# Patient Record
Sex: Male | Born: 1955 | Race: White | Hispanic: No | Marital: Married | Smoking: Former smoker
Health system: Southern US, Community
[De-identification: ages and names within clinical notes are randomized; demographics above are authoritative.]

## PROBLEM LIST (undated history)

## (undated) ENCOUNTER — Ambulatory Visit: Admission: EM | Source: Home / Self Care

## (undated) DIAGNOSIS — E119 Type 2 diabetes mellitus without complications: Secondary | ICD-10-CM

## (undated) DIAGNOSIS — E785 Hyperlipidemia, unspecified: Secondary | ICD-10-CM

## (undated) DIAGNOSIS — I10 Essential (primary) hypertension: Secondary | ICD-10-CM

## (undated) HISTORY — DX: Essential (primary) hypertension: I10

## (undated) HISTORY — DX: Type 2 diabetes mellitus without complications: E11.9

## (undated) HISTORY — DX: Hyperlipidemia, unspecified: E78.5

---
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
## 1981-09-01 HISTORY — PX: KNEE ARTHROSCOPY: SHX127

## 1988-09-01 HISTORY — PX: TENDON REPAIR: SHX5111

## 2007-09-02 HISTORY — PX: COLONOSCOPY: SHX174

## 2015-12-10 DIAGNOSIS — I129 Hypertensive chronic kidney disease with stage 1 through stage 4 chronic kidney disease, or unspecified chronic kidney disease: Secondary | ICD-10-CM | POA: Diagnosis not present

## 2015-12-10 DIAGNOSIS — N183 Chronic kidney disease, stage 3 (moderate): Secondary | ICD-10-CM | POA: Diagnosis not present

## 2015-12-11 DIAGNOSIS — E1122 Type 2 diabetes mellitus with diabetic chronic kidney disease: Secondary | ICD-10-CM | POA: Diagnosis not present

## 2015-12-11 DIAGNOSIS — I129 Hypertensive chronic kidney disease with stage 1 through stage 4 chronic kidney disease, or unspecified chronic kidney disease: Secondary | ICD-10-CM | POA: Insufficient documentation

## 2015-12-11 DIAGNOSIS — Z7984 Long term (current) use of oral hypoglycemic drugs: Secondary | ICD-10-CM | POA: Diagnosis not present

## 2015-12-11 DIAGNOSIS — N183 Chronic kidney disease, stage 3 unspecified: Secondary | ICD-10-CM | POA: Insufficient documentation

## 2016-02-13 DIAGNOSIS — N183 Chronic kidney disease, stage 3 (moderate): Secondary | ICD-10-CM | POA: Diagnosis not present

## 2016-02-13 DIAGNOSIS — E7800 Pure hypercholesterolemia, unspecified: Secondary | ICD-10-CM | POA: Diagnosis not present

## 2016-02-13 DIAGNOSIS — E119 Type 2 diabetes mellitus without complications: Secondary | ICD-10-CM | POA: Diagnosis not present

## 2016-02-13 DIAGNOSIS — I10 Essential (primary) hypertension: Secondary | ICD-10-CM | POA: Diagnosis not present

## 2016-04-17 DIAGNOSIS — N183 Chronic kidney disease, stage 3 (moderate): Secondary | ICD-10-CM | POA: Diagnosis not present

## 2016-04-21 DIAGNOSIS — N183 Chronic kidney disease, stage 3 (moderate): Secondary | ICD-10-CM | POA: Diagnosis not present

## 2016-04-21 DIAGNOSIS — E1122 Type 2 diabetes mellitus with diabetic chronic kidney disease: Secondary | ICD-10-CM | POA: Diagnosis not present

## 2016-04-21 DIAGNOSIS — I129 Hypertensive chronic kidney disease with stage 1 through stage 4 chronic kidney disease, or unspecified chronic kidney disease: Secondary | ICD-10-CM | POA: Diagnosis not present

## 2016-06-04 DIAGNOSIS — E291 Testicular hypofunction: Secondary | ICD-10-CM | POA: Diagnosis not present

## 2016-06-09 DIAGNOSIS — E291 Testicular hypofunction: Secondary | ICD-10-CM | POA: Diagnosis not present

## 2016-06-09 DIAGNOSIS — N5201 Erectile dysfunction due to arterial insufficiency: Secondary | ICD-10-CM | POA: Diagnosis not present

## 2016-09-02 DIAGNOSIS — E1165 Type 2 diabetes mellitus with hyperglycemia: Secondary | ICD-10-CM | POA: Diagnosis not present

## 2016-09-02 DIAGNOSIS — M109 Gout, unspecified: Secondary | ICD-10-CM | POA: Diagnosis not present

## 2016-09-02 DIAGNOSIS — Z23 Encounter for immunization: Secondary | ICD-10-CM | POA: Diagnosis not present

## 2016-09-02 DIAGNOSIS — E7800 Pure hypercholesterolemia, unspecified: Secondary | ICD-10-CM | POA: Diagnosis not present

## 2016-09-02 DIAGNOSIS — I10 Essential (primary) hypertension: Secondary | ICD-10-CM | POA: Diagnosis not present

## 2016-09-02 DIAGNOSIS — Z0000 Encounter for general adult medical examination without abnormal findings: Secondary | ICD-10-CM | POA: Diagnosis not present

## 2016-09-16 DIAGNOSIS — E291 Testicular hypofunction: Secondary | ICD-10-CM | POA: Diagnosis not present

## 2016-09-23 DIAGNOSIS — E291 Testicular hypofunction: Secondary | ICD-10-CM | POA: Diagnosis not present

## 2016-10-20 DIAGNOSIS — N183 Chronic kidney disease, stage 3 (moderate): Secondary | ICD-10-CM | POA: Diagnosis not present

## 2016-10-22 DIAGNOSIS — N183 Chronic kidney disease, stage 3 (moderate): Secondary | ICD-10-CM | POA: Diagnosis not present

## 2016-10-22 DIAGNOSIS — I129 Hypertensive chronic kidney disease with stage 1 through stage 4 chronic kidney disease, or unspecified chronic kidney disease: Secondary | ICD-10-CM | POA: Diagnosis not present

## 2016-10-22 DIAGNOSIS — E1122 Type 2 diabetes mellitus with diabetic chronic kidney disease: Secondary | ICD-10-CM | POA: Diagnosis not present

## 2016-11-10 DIAGNOSIS — E1165 Type 2 diabetes mellitus with hyperglycemia: Secondary | ICD-10-CM | POA: Diagnosis not present

## 2016-11-10 DIAGNOSIS — N183 Chronic kidney disease, stage 3 (moderate): Secondary | ICD-10-CM | POA: Diagnosis not present

## 2016-11-10 DIAGNOSIS — H8110 Benign paroxysmal vertigo, unspecified ear: Secondary | ICD-10-CM | POA: Diagnosis not present

## 2016-12-05 DIAGNOSIS — H40039 Anatomical narrow angle, unspecified eye: Secondary | ICD-10-CM | POA: Diagnosis not present

## 2017-02-25 DIAGNOSIS — N410 Acute prostatitis: Secondary | ICD-10-CM | POA: Diagnosis not present

## 2017-03-02 DIAGNOSIS — D729 Disorder of white blood cells, unspecified: Secondary | ICD-10-CM | POA: Diagnosis not present

## 2017-03-02 DIAGNOSIS — E119 Type 2 diabetes mellitus without complications: Secondary | ICD-10-CM | POA: Diagnosis not present

## 2017-04-08 DIAGNOSIS — E1165 Type 2 diabetes mellitus with hyperglycemia: Secondary | ICD-10-CM | POA: Diagnosis not present

## 2017-05-05 DIAGNOSIS — N183 Chronic kidney disease, stage 3 (moderate): Secondary | ICD-10-CM | POA: Diagnosis not present

## 2017-05-06 DIAGNOSIS — E1122 Type 2 diabetes mellitus with diabetic chronic kidney disease: Secondary | ICD-10-CM | POA: Diagnosis not present

## 2017-05-06 DIAGNOSIS — I129 Hypertensive chronic kidney disease with stage 1 through stage 4 chronic kidney disease, or unspecified chronic kidney disease: Secondary | ICD-10-CM | POA: Diagnosis not present

## 2017-05-06 DIAGNOSIS — N183 Chronic kidney disease, stage 3 (moderate): Secondary | ICD-10-CM | POA: Diagnosis not present

## 2017-06-01 DIAGNOSIS — Z125 Encounter for screening for malignant neoplasm of prostate: Secondary | ICD-10-CM | POA: Diagnosis not present

## 2017-06-01 DIAGNOSIS — E291 Testicular hypofunction: Secondary | ICD-10-CM | POA: Diagnosis not present

## 2017-06-12 DIAGNOSIS — E291 Testicular hypofunction: Secondary | ICD-10-CM | POA: Diagnosis not present

## 2017-06-12 DIAGNOSIS — N5201 Erectile dysfunction due to arterial insufficiency: Secondary | ICD-10-CM | POA: Diagnosis not present

## 2017-09-07 DIAGNOSIS — E119 Type 2 diabetes mellitus without complications: Secondary | ICD-10-CM | POA: Diagnosis not present

## 2017-09-07 DIAGNOSIS — E7800 Pure hypercholesterolemia, unspecified: Secondary | ICD-10-CM | POA: Diagnosis not present

## 2017-09-07 DIAGNOSIS — I10 Essential (primary) hypertension: Secondary | ICD-10-CM | POA: Diagnosis not present

## 2017-09-07 DIAGNOSIS — Z0000 Encounter for general adult medical examination without abnormal findings: Secondary | ICD-10-CM | POA: Diagnosis not present

## 2017-09-07 DIAGNOSIS — Z23 Encounter for immunization: Secondary | ICD-10-CM | POA: Diagnosis not present

## 2017-11-02 DIAGNOSIS — N183 Chronic kidney disease, stage 3 (moderate): Secondary | ICD-10-CM | POA: Diagnosis not present

## 2017-11-04 DIAGNOSIS — I129 Hypertensive chronic kidney disease with stage 1 through stage 4 chronic kidney disease, or unspecified chronic kidney disease: Secondary | ICD-10-CM | POA: Diagnosis not present

## 2017-11-04 DIAGNOSIS — E1122 Type 2 diabetes mellitus with diabetic chronic kidney disease: Secondary | ICD-10-CM | POA: Diagnosis not present

## 2017-11-04 DIAGNOSIS — N183 Chronic kidney disease, stage 3 (moderate): Secondary | ICD-10-CM | POA: Diagnosis not present

## 2018-03-15 DIAGNOSIS — N183 Chronic kidney disease, stage 3 (moderate): Secondary | ICD-10-CM | POA: Diagnosis not present

## 2018-03-15 DIAGNOSIS — I10 Essential (primary) hypertension: Secondary | ICD-10-CM | POA: Diagnosis not present

## 2018-03-15 DIAGNOSIS — E7800 Pure hypercholesterolemia, unspecified: Secondary | ICD-10-CM | POA: Diagnosis not present

## 2018-03-15 DIAGNOSIS — E1169 Type 2 diabetes mellitus with other specified complication: Secondary | ICD-10-CM | POA: Diagnosis not present

## 2018-03-15 DIAGNOSIS — Z1159 Encounter for screening for other viral diseases: Secondary | ICD-10-CM | POA: Diagnosis not present

## 2018-05-06 DIAGNOSIS — N183 Chronic kidney disease, stage 3 (moderate): Secondary | ICD-10-CM | POA: Diagnosis not present

## 2018-05-10 DIAGNOSIS — I129 Hypertensive chronic kidney disease with stage 1 through stage 4 chronic kidney disease, or unspecified chronic kidney disease: Secondary | ICD-10-CM | POA: Diagnosis not present

## 2018-05-10 DIAGNOSIS — N183 Chronic kidney disease, stage 3 (moderate): Secondary | ICD-10-CM | POA: Diagnosis not present

## 2018-05-10 DIAGNOSIS — E1122 Type 2 diabetes mellitus with diabetic chronic kidney disease: Secondary | ICD-10-CM | POA: Diagnosis not present

## 2018-07-13 DIAGNOSIS — R948 Abnormal results of function studies of other organs and systems: Secondary | ICD-10-CM | POA: Diagnosis not present

## 2018-07-13 DIAGNOSIS — E291 Testicular hypofunction: Secondary | ICD-10-CM | POA: Diagnosis not present

## 2018-07-21 DIAGNOSIS — E291 Testicular hypofunction: Secondary | ICD-10-CM | POA: Diagnosis not present

## 2018-07-21 DIAGNOSIS — N5201 Erectile dysfunction due to arterial insufficiency: Secondary | ICD-10-CM | POA: Diagnosis not present

## 2018-09-21 DIAGNOSIS — Z0000 Encounter for general adult medical examination without abnormal findings: Secondary | ICD-10-CM | POA: Diagnosis not present

## 2018-09-21 DIAGNOSIS — I10 Essential (primary) hypertension: Secondary | ICD-10-CM | POA: Diagnosis not present

## 2018-09-21 DIAGNOSIS — Z23 Encounter for immunization: Secondary | ICD-10-CM | POA: Diagnosis not present

## 2018-09-21 DIAGNOSIS — E119 Type 2 diabetes mellitus without complications: Secondary | ICD-10-CM | POA: Diagnosis not present

## 2018-09-21 DIAGNOSIS — E7800 Pure hypercholesterolemia, unspecified: Secondary | ICD-10-CM | POA: Diagnosis not present

## 2018-09-27 DIAGNOSIS — M720 Palmar fascial fibromatosis [Dupuytren]: Secondary | ICD-10-CM | POA: Insufficient documentation

## 2018-09-27 DIAGNOSIS — M79645 Pain in left finger(s): Secondary | ICD-10-CM | POA: Insufficient documentation

## 2018-11-04 DIAGNOSIS — N183 Chronic kidney disease, stage 3 (moderate): Secondary | ICD-10-CM | POA: Diagnosis not present

## 2018-11-08 DIAGNOSIS — J069 Acute upper respiratory infection, unspecified: Secondary | ICD-10-CM | POA: Diagnosis not present

## 2018-11-09 DIAGNOSIS — E1122 Type 2 diabetes mellitus with diabetic chronic kidney disease: Secondary | ICD-10-CM | POA: Diagnosis not present

## 2018-11-09 DIAGNOSIS — N183 Chronic kidney disease, stage 3 (moderate): Secondary | ICD-10-CM | POA: Diagnosis not present

## 2018-11-09 DIAGNOSIS — I129 Hypertensive chronic kidney disease with stage 1 through stage 4 chronic kidney disease, or unspecified chronic kidney disease: Secondary | ICD-10-CM | POA: Diagnosis not present

## 2019-03-29 DIAGNOSIS — N183 Chronic kidney disease, stage 3 (moderate): Secondary | ICD-10-CM | POA: Diagnosis not present

## 2019-03-29 DIAGNOSIS — E7800 Pure hypercholesterolemia, unspecified: Secondary | ICD-10-CM | POA: Diagnosis not present

## 2019-03-29 DIAGNOSIS — Z23 Encounter for immunization: Secondary | ICD-10-CM | POA: Diagnosis not present

## 2019-03-29 DIAGNOSIS — I10 Essential (primary) hypertension: Secondary | ICD-10-CM | POA: Diagnosis not present

## 2019-03-29 DIAGNOSIS — E1169 Type 2 diabetes mellitus with other specified complication: Secondary | ICD-10-CM | POA: Diagnosis not present

## 2019-05-12 DIAGNOSIS — N183 Chronic kidney disease, stage 3 (moderate): Secondary | ICD-10-CM | POA: Diagnosis not present

## 2019-05-16 DIAGNOSIS — N183 Chronic kidney disease, stage 3 (moderate): Secondary | ICD-10-CM | POA: Diagnosis not present

## 2019-05-16 DIAGNOSIS — E559 Vitamin D deficiency, unspecified: Secondary | ICD-10-CM | POA: Diagnosis not present

## 2019-05-16 DIAGNOSIS — E1122 Type 2 diabetes mellitus with diabetic chronic kidney disease: Secondary | ICD-10-CM | POA: Diagnosis not present

## 2019-05-16 DIAGNOSIS — I129 Hypertensive chronic kidney disease with stage 1 through stage 4 chronic kidney disease, or unspecified chronic kidney disease: Secondary | ICD-10-CM | POA: Diagnosis not present

## 2019-06-29 ENCOUNTER — Ambulatory Visit (INDEPENDENT_AMBULATORY_CARE_PROVIDER_SITE_OTHER): Payer: BC Managed Care – PPO | Admitting: Registered Nurse

## 2019-06-29 ENCOUNTER — Other Ambulatory Visit: Payer: Self-pay

## 2019-06-29 ENCOUNTER — Encounter: Payer: Self-pay | Admitting: Registered Nurse

## 2019-06-29 VITALS — BP 142/82 | HR 86 | Temp 98.6°F | Ht 70.0 in | Wt 220.0 lb

## 2019-06-29 DIAGNOSIS — N489 Disorder of penis, unspecified: Secondary | ICD-10-CM | POA: Diagnosis not present

## 2019-06-29 NOTE — Progress Notes (Signed)
Acute Office Visit  Subjective:    Patient ID: Samuel Sanders, male    DOB: Oct 18, 1955, 63 y.o.   MRN: [id number]  Chief Complaint  Patient presents with  . Mass    possibly establish care. something on private area. needs to be looked at     HPI Patient is in today for concern over lesion on shaft of penis.  States that on Saturday night during sexual activity, he is concerned that his wife's nail may have cut his penis between the shaft and glans. There was some blood, swelling,and tenderness. Notes drastic improvement since, but still open lesion. He has been applying neosporin daily.  No past medical history on file.  History reviewed. No pertinent surgical history.  No family history on file.  Social History   Socioeconomic History  . Marital status: Unknown    Spouse name: Not on file  . Number of children: Not on file  . Years of education: Not on file  . Highest education level: Not on file  Occupational History  . Not on file  Social Needs  . Financial resource strain: Not on file  . Food insecurity    Worry: Not on file    Inability: Not on file  . Transportation needs    Medical: Not on file    Non-medical: Not on file  Tobacco Use  . Smoking status: Former Smoker    Quit date: 06/28/2004    Years since quitting: 15.0  . Smokeless tobacco: Never Used  Substance and Sexual Activity  . Alcohol use: Not Currently  . Drug use: Not Currently  . Sexual activity: Not on file  Lifestyle  . Physical activity    Days per week: Not on file    Minutes per session: Not on file  . Stress: Not on file  Relationships  . Social Herbalist on phone: Not on file    Gets together: Not on file    Attends religious service: Not on file    Active member of club or organization: Not on file    Attends meetings of clubs or organizations: Not on file    Relationship status: Not on file  . Intimate partner violence    Fear of current or ex partner: Not on  file    Emotionally abused: Not on file    Physically abused: Not on file    Forced sexual activity: Not on file  Other Topics Concern  . Not on file  Social History Narrative  . Not on file    Outpatient Medications Prior to Visit  Medication Sig Dispense Refill  . allopurinol (ZYLOPRIM) 100 MG tablet allopurinol 100 mg tablet    . amLODipine-benazepril (LOTREL) 5-20 MG capsule Take by mouth.    Marland Kitchen atorvastatin (LIPITOR) 20 MG tablet Take 20 mg by mouth daily.    . fenofibrate (TRICOR) 145 MG tablet TAKE 1 TABLET ONCE A DAY ORALLY    . glimepiride (AMARYL) 2 MG tablet glimepiride 2 mg tablet    . INVOKANA 100 MG TABS tablet Take 100 mg by mouth daily.    . metFORMIN (GLUCOPHAGE) 1000 MG tablet TAKE 1/2 TABLET BY MOUTH EVERY MORNING AND 1 & 1/2 TABLETS IN THE EVENING    . Omega-3 1000 MG CAPS Take by mouth.    . testosterone cypionate (DEPOTESTOSTERONE CYPIONATE) 200 MG/ML injection Inject 200 mg into the muscle once a week.    . Travoprost, BAK Free, (TRAVATAN Z) 0.004 %  SOLN ophthalmic solution PUT 1 DROP INTO BOTH EYES AT BEDTIME AS DIRECTED     No facility-administered medications prior to visit.     No Known Allergies  Review of Systems  Constitutional: Negative.   HENT: Negative.   Eyes: Negative.   Respiratory: Negative.   Cardiovascular: Negative.   Gastrointestinal: Negative.   Genitourinary: Negative.  Negative for dysuria, flank pain, frequency, hematuria and urgency.  Musculoskeletal: Negative.   Skin: Negative.   Neurological: Negative.   Endo/Heme/Allergies: Negative.   Psychiatric/Behavioral: Negative.   All other systems reviewed and are negative.      Objective:    Physical Exam  Genitourinary:       BP (!) 142/82 (BP Location: Left Arm, Patient Position: Sitting, Cuff Size: Normal)   Pulse 86   Temp 98.6 F (37 C) (Oral)   Ht _0  (1.778 m)   Wt 220 lb (99.8 kg)   SpO2 97%   BMI 31.57 kg/m  Wt Readings from Last 3 Encounters:   06/29/19 220 lb (99.8 kg)    Health Maintenance Due  Topic Date Due  . Hepatitis C Screening  29-Sep-1955  . HIV Screening  04/01/1970  . TETANUS/TDAP  04/01/1974  . COLONOSCOPY  04/01/2005  . INFLUENZA VACCINE  04/02/2019    There are no preventive care reminders to display for this patient.   No results found for: TSH No results found for: WBC, HGB, HCT, MCV, PLT No results found for: NA, K, CHLORIDE, CO2, GLUCOSE, BUN, CREATININE, BILITOT, ALKPHOS, AST, ALT, PROT, ALBUMIN, CALCIUM, ANIONGAP, EGFR, GFR No results found for: CHOL No results found for: HDL No results found for: LDLCALC No results found for: TRIG No results found for: CHOLHDL No results found for: HGBA1C     Assessment & Plan:   Problem List Items Addressed This Visit    None    Visit Diagnoses    Lesion of penis    -  Primary       No orders of the defined types were placed in this encounter.  PLAN  Discussed that this wound looks very straight forward and like it is healing appropriately.   Continue to wash daily, apply neosporin, and try to keep clean and dry  Return to clinic if wound worsens or fails to improve  Patient encouraged to call clinic with any questions, comments, or concerns.    Maximiano Coss, NP

## 2019-06-29 NOTE — Patient Instructions (Signed)
° ° ° °  If you have lab work done today you will be contacted with your lab results within the next 2 weeks.  If you have not heard from us then please contact us. The fastest way to get your results is to register for My Chart. ° ° °IF you received an x-ray today, you will receive an invoice from [facility]. Please contact [facility] at [phone] with questions or concerns regarding your invoice.  ° °IF you received labwork today, you will receive an invoice from [facility]. Please contact [facility] at [phone] with questions or concerns regarding your invoice.  ° °Our billing staff will not be able to assist you with questions regarding bills from these companies. ° °You will be contacted with the lab results as soon as they are available. The fastest way to get your results is to activate your My Chart account. Instructions are located on the last page of this paperwork. If you have not heard from us regarding the results in 2 weeks, please contact this office. °  ° ° ° °

## 2019-07-04 ENCOUNTER — Encounter: Payer: Self-pay | Admitting: Family Medicine

## 2019-07-04 ENCOUNTER — Ambulatory Visit: Payer: BC Managed Care – PPO | Admitting: Family Medicine

## 2019-07-04 ENCOUNTER — Other Ambulatory Visit: Payer: Self-pay

## 2019-07-04 VITALS — BP 150/77 | HR 80 | Temp 98.2°F | Ht 70.0 in | Wt 221.2 lb

## 2019-07-04 DIAGNOSIS — Z20822 Contact with and (suspected) exposure to covid-19: Secondary | ICD-10-CM

## 2019-07-04 DIAGNOSIS — Z20828 Contact with and (suspected) exposure to other viral communicable diseases: Secondary | ICD-10-CM

## 2019-07-04 DIAGNOSIS — Z7189 Other specified counseling: Secondary | ICD-10-CM

## 2019-07-04 DIAGNOSIS — R42 Dizziness and giddiness: Secondary | ICD-10-CM

## 2019-07-04 NOTE — Patient Instructions (Signed)
° ° ° °  If you have lab work done today you will be contacted with your lab results within the next 2 weeks.  If you have not heard from us then please contact us. The fastest way to get your results is to register for My Chart. ° ° °IF you received an x-ray today, you will receive an invoice from [facility]. Please contact [facility] at [phone] with questions or concerns regarding your invoice.  ° °IF you received labwork today, you will receive an invoice from [facility]. Please contact [facility] at [phone] with questions or concerns regarding your invoice.  ° °Our billing staff will not be able to assist you with questions regarding bills from these companies. ° °You will be contacted with the lab results as soon as they are available. The fastest way to get your results is to activate your My Chart account. Instructions are located on the last page of this paperwork. If you have not heard from us regarding the results in 2 weeks, please contact this office. °  ° ° ° °

## 2019-07-04 NOTE — Progress Notes (Signed)
Established Patient Office Visit  Subjective:  Patient ID: Samuel Sanders, male    DOB: 03/08/1956  Age: 63 y.o. MRN: [id number]  CC:  Chief Complaint  Patient presents with  . Dizziness  . off balance    started Friday night     HPI [name] presents for   COVID LIKE SYMPTOMS Starting on 07/01/2019 he woke up at 5:30am and then sat in a chair and fell asleep He woke up from the chair at 10:30am He states that on the way home he was shivering He took his temperature 102 He also had a headache but felt very dizzy He reports that he just felt "off"  His temperature went to normal after the weekend  He denies cough, shortness of breath and diarrhea  He reports that he feels like his symptoms resolve  He states that over the past few days he had a difficulty with his appetite and did not feel like eating He was able to taste and smell.   Essential Hypertension He is on amlodipine-benazepril 5-20mg  He reports that at home his bp is 118/70 and is well controlled He checks it a few times a week and his readings are always normal except at the doctor's office. He is a former smoker. BP Readings from Last 3 Encounters:  07/04/19 (!) 150/77  06/29/19 (!) 142/82    No past medical history on file.  No past surgical history on file.  No family history on file.  Social History   Socioeconomic History  . Marital status: Unknown    Spouse name: Not on file  . Number of children: Not on file  . Years of education: Not on file  . Highest education level: Not on file  Occupational History  . Not on file  Social Needs  . Financial resource strain: Not on file  . Food insecurity    Worry: Not on file    Inability: Not on file  . Transportation needs    Medical: Not on file    Non-medical: Not on file  Tobacco Use  . Smoking status: Former Smoker    Quit date: 06/28/2004    Years since quitting: 15.0  . Smokeless tobacco: Never Used  Substance and Sexual  Activity  . Alcohol use: Not Currently  . Drug use: Not Currently  . Sexual activity: Not on file  Lifestyle  . Physical activity    Days per week: Not on file    Minutes per session: Not on file  . Stress: Not on file  Relationships  . Social Herbalist on phone: Not on file    Gets together: Not on file    Attends religious service: Not on file    Active member of club or organization: Not on file    Attends meetings of clubs or organizations: Not on file    Relationship status: Not on file  . Intimate partner violence    Fear of current or ex partner: Not on file    Emotionally abused: Not on file    Physically abused: Not on file    Forced sexual activity: Not on file  Other Topics Concern  . Not on file  Social History Narrative  . Not on file    Outpatient Medications Prior to Visit  Medication Sig Dispense Refill  . allopurinol (ZYLOPRIM) 100 MG tablet allopurinol 100 mg tablet    . amLODipine-benazepril (LOTREL) 5-20 MG capsule Take by mouth.    Marland Kitchen  atorvastatin (LIPITOR) 20 MG tablet Take 20 mg by mouth daily.    . fenofibrate (TRICOR) 145 MG tablet TAKE 1 TABLET ONCE A DAY ORALLY    . glimepiride (AMARYL) 2 MG tablet glimepiride 2 mg tablet    . INVOKANA 100 MG TABS tablet Take 100 mg by mouth daily.    . metFORMIN (GLUCOPHAGE) 1000 MG tablet TAKE 1/2 TABLET BY MOUTH EVERY MORNING AND 1 & 1/2 TABLETS IN THE EVENING    . Omega-3 1000 MG CAPS Take by mouth.    . testosterone cypionate (DEPOTESTOSTERONE CYPIONATE) 200 MG/ML injection Inject 200 mg into the muscle once a week.    . Travoprost, BAK Free, (TRAVATAN Z) 0.004 % SOLN ophthalmic solution PUT 1 DROP INTO BOTH EYES AT BEDTIME AS DIRECTED     No facility-administered medications prior to visit.     No Known Allergies  ROS Review of Systems Review of Systems  Constitutional: SEE HPI HENT: Negative for congestion, nosebleeds, trouble swallowing and voice change.   Respiratory: Negative for cough,  shortness of breath and wheezing.   Gastrointestinal: Negative for diarrhea, nausea and vomiting.  Genitourinary: Negative for difficulty urinating, dysuria, flank pain and hematuria.  Musculoskeletal: see hpi Neurological: see hpi See HPI. All other review of systems negative.     Objective:    Physical Exam  BP (!) 150/77   Pulse 80   Temp 98.2 F (36.8 C) (Oral)   Ht 5\' 10"  (1.778 m)   Wt 221 lb 3.2 oz (100.3 kg)   SpO2 97%   BMI 31.74 kg/m  Wt Readings from Last 3 Encounters:  07/04/19 221 lb 3.2 oz (100.3 kg)  06/29/19 220 lb (99.8 kg)   Physical Exam  Constitutional: Oriented to person, place, and time. Appears well-developed and well-nourished.  HENT:  Head: Normocephalic and atraumatic.  Eyes: Conjunctivae and EOM are normal.  Ears: TM clear bilaterally Cardiovascular: Normal rate, regular rhythm, normal heart sounds and intact distal pulses.  No murmur heard. Pulmonary/Chest: Effort normal and breath sounds normal. No stridor. No respiratory distress. Has no wheezes.  Abdomen: nondistended, normoactive bs, soft, nontender Neurological: Is alert and oriented to person, place, and time.  Skin: Skin is warm. Capillary refill takes less than 2 seconds.  Psychiatric: Has a normal mood and affect. Behavior is normal. Judgment and thought content normal.    Health Maintenance Due  Topic Date Due  . Hepatitis C Screening  1955-09-03  . HIV Screening  04/01/1970  . TETANUS/TDAP  04/01/1974  . COLONOSCOPY  04/01/2005  . INFLUENZA VACCINE  04/02/2019    There are no preventive care reminders to display for this patient.     Assessment & Plan:   Problem List Items Addressed This Visit    None    Visit Diagnoses    Exposure to COVID-19 virus    -  Primary   Relevant Orders   Novel Coronavirus, NAA ([facility])   Educated about COVID-19 virus infection       Relevant Orders   Novel Coronavirus, NAA ([facility])   Dizziness       Relevant Orders   Novel  Coronavirus, NAA ([facility])     Performed testing for covid Pt advised to start quarantine immediately He is improving so now meds needed for symptomatic relief Discussed next steps - verifying status and if positive finish 14 day quarantine but if negative may return to work He works in [employer] and should continue to wear a mask at  all times once he resumes work.  No orders of the defined types were placed in this encounter.   Follow-up: No follow-ups on file.    Forrest Moron, MD

## 2019-07-05 LAB — NOVEL CORONAVIRUS, NAA: SARS-CoV-2, NAA: NOT DETECTED

## 2019-07-13 DIAGNOSIS — E291 Testicular hypofunction: Secondary | ICD-10-CM | POA: Diagnosis not present

## 2019-07-13 DIAGNOSIS — Z125 Encounter for screening for malignant neoplasm of prostate: Secondary | ICD-10-CM | POA: Diagnosis not present

## 2019-07-20 DIAGNOSIS — N5201 Erectile dysfunction due to arterial insufficiency: Secondary | ICD-10-CM | POA: Diagnosis not present

## 2019-07-20 DIAGNOSIS — E291 Testicular hypofunction: Secondary | ICD-10-CM | POA: Diagnosis not present

## 2019-10-14 ENCOUNTER — Encounter: Payer: Self-pay | Admitting: Registered Nurse

## 2019-10-14 ENCOUNTER — Ambulatory Visit (INDEPENDENT_AMBULATORY_CARE_PROVIDER_SITE_OTHER): Payer: BC Managed Care – PPO | Admitting: Registered Nurse

## 2019-10-14 ENCOUNTER — Other Ambulatory Visit: Payer: Self-pay

## 2019-10-14 VITALS — BP 156/63 | HR 62 | Temp 97.3°F | Ht 70.0 in | Wt 226.8 lb

## 2019-10-14 DIAGNOSIS — Z23 Encounter for immunization: Secondary | ICD-10-CM

## 2019-10-14 DIAGNOSIS — Z0000 Encounter for general adult medical examination without abnormal findings: Secondary | ICD-10-CM

## 2019-10-14 DIAGNOSIS — Z1211 Encounter for screening for malignant neoplasm of colon: Secondary | ICD-10-CM | POA: Diagnosis not present

## 2019-10-14 NOTE — Patient Instructions (Signed)
° ° ° °  If you have lab work done today you will be contacted with your lab results within the next 2 weeks.  If you have not heard from us then please contact us. The fastest way to get your results is to register for My Chart. ° ° °IF you received an x-ray today, you will receive an invoice from [facility]. Please contact [facility] at [phone] with questions or concerns regarding your invoice.  ° °IF you received labwork today, you will receive an invoice from [facility]. Please contact [facility] at [phone] with questions or concerns regarding your invoice.  ° °Our billing staff will not be able to assist you with questions regarding bills from these companies. ° °You will be contacted with the lab results as soon as they are available. The fastest way to get your results is to activate your My Chart account. Instructions are located on the last page of this paperwork. If you have not heard from us regarding the results in 2 weeks, please contact this office. °  ° ° ° °

## 2019-10-21 ENCOUNTER — Encounter: Payer: Self-pay | Admitting: Registered Nurse

## 2019-10-21 NOTE — Progress Notes (Signed)
Established Patient Office Visit  Subjective:  Patient ID: Samuel Sanders, male    DOB: [birth year]  Age: 64 y.o. MRN: [id number]  CC:  Chief Complaint  Patient presents with  . Transitions Of Care    transfer of care    HPI Samuel Sanders presents for visit to establish care.  Switching from [place] MD to our office Reviewed record release with him and had him sign this.  Due for colonoscopy, will refer to GI Due for flu shot, it will be given today  Otherwise no complaints. Feels well overall.   No past medical history on file.  No past surgical history on file.  Family History  Problem Relation Age of Onset  . Cancer Mother   . Healthy Father   . Brain cancer Sister     Social History   Socioeconomic History  . Marital status: Unknown    Spouse name: Not on file  . Number of children: Not on file  . Years of education: Not on file  . Highest education level: Not on file  Occupational History  . Not on file  Tobacco Use  . Smoking status: Former Smoker    Quit date: 06/28/2004    Years since quitting: 15.3  . Smokeless tobacco: Never Used  Substance and Sexual Activity  . Alcohol use: Not Currently  . Drug use: Not Currently  . Sexual activity: Yes  Other Topics Concern  . Not on file  Social History Narrative  . Not on file   Social Determinants of Health   Financial Resource Strain:   . Difficulty of Paying Living Expenses: Not on file  Food Insecurity:   . Worried About Charity fundraiser in the Last Year: Not on file  . Ran Out of Food in the Last Year: Not on file  Transportation Needs:   . Lack of Transportation (Medical): Not on file  . Lack of Transportation (Non-Medical): Not on file  Physical Activity:   . Days of Exercise per Week: Not on file  . Minutes of Exercise per Session: Not on file  Stress:   . Feeling of Stress : Not on file  Social Connections:   . Frequency of Communication with Friends and Family: Not on file  .  Frequency of Social Gatherings with Friends and Family: Not on file  . Attends Religious Services: Not on file  . Active Member of Clubs or Organizations: Not on file  . Attends Archivist Meetings: Not on file  . Marital Status: Not on file  Intimate Partner Violence:   . Fear of Current or Ex-Partner: Not on file  . Emotionally Abused: Not on file  . Physically Abused: Not on file  . Sexually Abused: Not on file    Outpatient Medications Prior to Visit  Medication Sig Dispense Refill  . allopurinol (ZYLOPRIM) 100 MG tablet allopurinol 100 mg tablet    . amLODipine-benazepril (LOTREL) 5-20 MG capsule Take by mouth.    Marland Kitchen atorvastatin (LIPITOR) 20 MG tablet Take 20 mg by mouth daily.    . fenofibrate (TRICOR) 145 MG tablet TAKE 1 TABLET ONCE A DAY ORALLY    . glimepiride (AMARYL) 2 MG tablet glimepiride 2 mg tablet    . INVOKANA 100 MG TABS tablet Take 100 mg by mouth daily.    . metFORMIN (GLUCOPHAGE) 1000 MG tablet TAKE 1/2 TABLET BY MOUTH EVERY MORNING AND 1 & 1/2 TABLETS IN THE EVENING    . Omega-3  1000 MG CAPS Take by mouth.    . testosterone cypionate (DEPOTESTOSTERONE CYPIONATE) 200 MG/ML injection Inject 200 mg into the muscle once a week.    . Travoprost, BAK Free, (TRAVATAN Z) 0.004 % SOLN ophthalmic solution PUT 1 DROP INTO BOTH EYES AT BEDTIME AS DIRECTED     No facility-administered medications prior to visit.    No Known Allergies  ROS Review of Systems  Constitutional: Negative.   HENT: Negative.   Eyes: Negative.   Respiratory: Negative.   Cardiovascular: Negative.   Gastrointestinal: Negative.   Endocrine: Negative.   Genitourinary: Negative.   Musculoskeletal: Negative.   Skin: Negative.   Allergic/Immunologic: Negative.   Neurological: Negative.   Hematological: Negative.   Psychiatric/Behavioral: Negative.   All other systems reviewed and are negative.     Objective:    Physical Exam  Constitutional: He is oriented to person, place,  and time. He appears well-developed and well-nourished. No distress.  Cardiovascular: Normal rate and regular rhythm.  Pulmonary/Chest: Effort normal. No respiratory distress.  Neurological: He is alert and oriented to person, place, and time.  Skin: Skin is warm and dry. No rash noted. He is not diaphoretic. No erythema. No pallor.  Psychiatric: He has a normal mood and affect. His behavior is normal. Judgment and thought content normal.  Nursing note and vitals reviewed.   BP (!) 156/63   Pulse 62   Temp (!) 97.3 F (36.3 C) (Temporal)   Ht 5' 10"  (1.778 m)   Wt 226 lb 12.8 oz (102.9 kg)   SpO2 98%   BMI 32.54 kg/m  Wt Readings from Last 3 Encounters:  10/14/19 226 lb 12.8 oz (102.9 kg)  07/04/19 221 lb 3.2 oz (100.3 kg)  06/29/19 220 lb (99.8 kg)     Health Maintenance Due  Topic Date Due  . Hepatitis C Screening  [birth year]  . HIV Screening  04/01/1970  . COLONOSCOPY  04/01/2005    There are no preventive care reminders to display for this patient.  No results found for: TSH No results found for: WBC, HGB, HCT, MCV, PLT No results found for: NA, K, CHLORIDE, CO2, GLUCOSE, BUN, CREATININE, BILITOT, ALKPHOS, AST, ALT, PROT, ALBUMIN, CALCIUM, ANIONGAP, EGFR, GFR No results found for: CHOL No results found for: HDL No results found for: LDLCALC No results found for: TRIG No results found for: CHOLHDL No results found for: HGBA1C    Assessment & Plan:   Problem List Items Addressed This Visit    None    Visit Diagnoses    Need for prophylactic vaccination and inoculation against influenza    -  Primary   Relevant Orders   Flu Vaccine QUAD 36+ mos IM (Completed)   Screening for colon cancer       Relevant Orders   Ambulatory referral to Gastroenterology      No orders of the defined types were placed in this encounter.   Follow-up: No follow-ups on file.   PLAN  Transfer of care and coordination of preventative care today  He will return for CPE when  he is due  Otherwise no concerns  Patient encouraged to call clinic with any questions, comments, or concerns.  Maximiano Coss, NP

## 2019-10-26 ENCOUNTER — Ambulatory Visit: Payer: BC Managed Care – PPO | Attending: Internal Medicine

## 2019-10-26 DIAGNOSIS — Z20822 Contact with and (suspected) exposure to covid-19: Secondary | ICD-10-CM | POA: Insufficient documentation

## 2019-10-27 LAB — NOVEL CORONAVIRUS, NAA: SARS-CoV-2, NAA: NOT DETECTED

## 2019-11-01 ENCOUNTER — Encounter: Payer: Self-pay | Admitting: Registered Nurse

## 2019-11-01 NOTE — Telephone Encounter (Signed)
Called Patient and LVM

## 2019-11-02 ENCOUNTER — Encounter: Payer: Self-pay | Admitting: Registered Nurse

## 2019-11-06 ENCOUNTER — Ambulatory Visit: Payer: Self-pay | Attending: Internal Medicine

## 2019-11-06 DIAGNOSIS — Z23 Encounter for immunization: Secondary | ICD-10-CM | POA: Insufficient documentation

## 2019-11-06 NOTE — Progress Notes (Signed)
   Covid-19 Vaccination Clinic  Name:  Samuel Sanders    MRN: [id number] DOB: 1955-10-27  11/06/2019  [name] was observed post Covid-19 immunization for 15 minutes without incident. He was provided with Vaccine Information Sheet and instruction to access the V-Safe system.   [name] was instructed to call 911 with any severe reactions post vaccine: Marland Kitchen Difficulty breathing  . Swelling of face and throat  . A fast heartbeat  . A bad rash all over body  . Dizziness and weakness   Immunizations Administered    Name Date Dose VIS Date Route   Pfizer COVID-19 Vaccine 11/06/2019  4:21 PM 0.3 mL 08/12/2019 Intramuscular   Manufacturer: [place]   Lot: [id number]   [place]: [id number]

## 2019-11-07 ENCOUNTER — Ambulatory Visit: Payer: BC Managed Care – PPO | Admitting: Registered Nurse

## 2019-11-09 DIAGNOSIS — N183 Chronic kidney disease, stage 3 unspecified: Secondary | ICD-10-CM | POA: Diagnosis not present

## 2019-11-11 ENCOUNTER — Encounter: Payer: Self-pay | Admitting: Registered Nurse

## 2019-11-11 ENCOUNTER — Other Ambulatory Visit: Payer: Self-pay

## 2019-11-11 ENCOUNTER — Ambulatory Visit: Payer: BC Managed Care – PPO | Admitting: Registered Nurse

## 2019-11-11 VITALS — BP 156/75 | HR 62 | Temp 98.0°F | Ht 70.0 in | Wt 222.6 lb

## 2019-11-11 DIAGNOSIS — Z1329 Encounter for screening for other suspected endocrine disorder: Secondary | ICD-10-CM | POA: Diagnosis not present

## 2019-11-11 DIAGNOSIS — Z0000 Encounter for general adult medical examination without abnormal findings: Secondary | ICD-10-CM

## 2019-11-11 DIAGNOSIS — Z1211 Encounter for screening for malignant neoplasm of colon: Secondary | ICD-10-CM | POA: Diagnosis not present

## 2019-11-11 DIAGNOSIS — Z13220 Encounter for screening for lipoid disorders: Secondary | ICD-10-CM | POA: Diagnosis not present

## 2019-11-11 DIAGNOSIS — Z130 Encounter for screening for diseases of the blood and blood-forming organs and certain disorders involving the immune mechanism: Secondary | ICD-10-CM

## 2019-11-11 DIAGNOSIS — Z13228 Encounter for screening for other metabolic disorders: Secondary | ICD-10-CM | POA: Diagnosis not present

## 2019-11-11 NOTE — Patient Instructions (Signed)
° ° ° °  If you have lab work done today you will be contacted with your lab results within the next 2 weeks.  If you have not heard from us then please contact us. The fastest way to get your results is to register for My Chart. ° ° °IF you received an x-ray today, you will receive an invoice from [facility]. Please contact [facility] at [phone] with questions or concerns regarding your invoice.  ° °IF you received labwork today, you will receive an invoice from [facility]. Please contact [facility] at [phone] with questions or concerns regarding your invoice.  ° °Our billing staff will not be able to assist you with questions regarding bills from these companies. ° °You will be contacted with the lab results as soon as they are available. The fastest way to get your results is to activate your My Chart account. Instructions are located on the last page of this paperwork. If you have not heard from us regarding the results in 2 weeks, please contact this office. °  ° ° ° °

## 2019-11-11 NOTE — Progress Notes (Signed)
Established Patient Office Visit  Subjective:  Patient ID: Samuel Sanders, male    DOB: 1956/01/27  Age: 64 y.o. MRN: [id number]  CC:  Chief Complaint  Patient presents with  . Follow-up    physical    HPI Samuel Sanders presents for CPE and labs  No major concerns at this time - feeling well overall.   Gout: taking allopurinol 148m Po qd with good effect  T2DM: taking metformin 10086mPO bid, invokana 10070mO qd, glimepiride 2mg38m qd with good effect. States his T2DM has been under good control for some time, rarely A1cs above 7 but notes a poor diet recently that might push this up.   HTN: taking amlodipine-benzapril 5-20mg75mqd with good effect. Denies CV symptoms.  HLD: taking fenofibrate 145mg 69md with good effect. Also takes omega 3 supplement.   Low testosterone: has been receiving tesosterone shots  Reports a fair amount of recent stress due to the waxing and waning nature of his work and his wife's alcohol addiction. He is unsure of what more he can do to address this, but is optmistic COVID ending and the summer weather approaching will bring positive change for him  Past Medical History:  Diagnosis Date  . Essential hypertension   . Hyperlipidemia   . Type 2 diabetes mellitus (HCC)  [facility]. No pertinent surgical history.  Family History  Problem Relation Age of Onset  . Cancer Mother   . Healthy Father   . Brain cancer Sister     Social History   Socioeconomic History  . Marital status: Married    Spouse name: Not on file  . Number of children: Not on file  . Years of education: Not on file  . Highest education level: Not on file  Occupational History  . Not on file  Tobacco Use  . Smoking status: Former Smoker    Quit date: 06/28/2004    Years since quitting: 15.3  . Smokeless tobacco: Never Used  Substance and Sexual Activity  . Alcohol use: Not Currently  . Drug use: Not Currently  . Sexual activity: Yes  Other Topics  Concern  . Not on file  Social History Narrative  . Not on file   Social Determinants of Health   Financial Resource Strain:   . Difficulty of Paying Living Expenses:   Food Insecurity:   . Worried About RunninCharity fundraisere Last Year:   . Ran OuArboriculturiste Last Year:   Transportation Needs:   . Lack oFilm/video editorcal):   . LackMarland Kitchenof Transportation (Non-Medical):   Physical Activity:   . Days of Exercise per Week:   . Minutes of Exercise per Session:   Stress:   . Feeling of Stress :   Social Connections:   . Frequency of Communication with Friends and Family:   . Frequency of Social Gatherings with Friends and Family:   . Attends Religious Services:   . Active Member of Clubs or Organizations:   . Attends Club oArchivistngs:   . MariMarland Kitchenal Status:   Intimate Partner Violence:   . Fear of Current or Ex-Partner:   . Emotionally Abused:   . PhysMarland Kitchencally Abused:   . Sexually Abused:     Outpatient Medications Prior to Visit  Medication Sig Dispense Refill  . allopurinol (ZYLOPRIM) 100 MG tablet allopurinol 100 mg tablet    . amLODipine-benazepril (LOTREL) 5-20 MG capsule Take  by mouth.    Marland Kitchen atorvastatin (LIPITOR) 20 MG tablet Take 20 mg by mouth daily.    . fenofibrate (TRICOR) 145 MG tablet TAKE 1 TABLET ONCE A DAY ORALLY    . glimepiride (AMARYL) 2 MG tablet glimepiride 2 mg tablet    . INVOKANA 100 MG TABS tablet Take 100 mg by mouth daily.    . metFORMIN (GLUCOPHAGE) 1000 MG tablet TAKE 1/2 TABLET BY MOUTH EVERY MORNING AND 1 & 1/2 TABLETS IN THE EVENING    . Omega-3 1000 MG CAPS Take by mouth.    . testosterone cypionate (DEPOTESTOSTERONE CYPIONATE) 200 MG/ML injection Inject 200 mg into the muscle once a week.    . Travoprost, BAK Free, (TRAVATAN Z) 0.004 % SOLN ophthalmic solution PUT 1 DROP INTO BOTH EYES AT BEDTIME AS DIRECTED     No facility-administered medications prior to visit.    No Known Allergies  ROS Review of Systems    Constitutional: Negative.   HENT: Negative.   Eyes: Negative.   Respiratory: Negative.   Cardiovascular: Negative.   Gastrointestinal: Negative.   Endocrine: Negative.   Genitourinary: Negative.   Musculoskeletal: Negative.   Skin: Negative.   Allergic/Immunologic: Negative.   Neurological: Negative.   Hematological: Negative.   Psychiatric/Behavioral: Negative.   All other systems reviewed and are negative.     Objective:    Physical Exam  Constitutional: He is oriented to person, place, and time. He appears well-developed and well-nourished. No distress.  HENT:  Head: Normocephalic and atraumatic.  Right Ear: External ear normal.  Left Ear: External ear normal.  Nose: Nose normal.  Mouth/Throat: Oropharynx is clear and moist. No oropharyngeal exudate.  Eyes: Pupils are equal, round, and reactive to light. Conjunctivae and EOM are normal. Right eye exhibits no discharge. Left eye exhibits no discharge. No scleral icterus.  Neck: No tracheal deviation present. No thyromegaly present.  Cardiovascular: Normal rate, regular rhythm, normal heart sounds and intact distal pulses. Exam reveals no gallop and no friction rub.  No murmur heard. Pulmonary/Chest: Effort normal and breath sounds normal. No respiratory distress. He has no wheezes. He has no rales. He exhibits no tenderness.  Abdominal: Soft. Bowel sounds are normal. He exhibits no distension and no mass. There is no abdominal tenderness. There is no rebound and no guarding.  Musculoskeletal:        General: No tenderness, deformity or edema. Normal range of motion.     Cervical back: Normal range of motion and neck supple.  Lymphadenopathy:    He has no cervical adenopathy.  Neurological: He is alert and oriented to person, place, and time. No cranial nerve deficit. He exhibits normal muscle tone. Coordination normal.  Skin: Skin is warm and dry. No rash noted. He is not diaphoretic. No erythema. No pallor.  Psychiatric:  He has a normal mood and affect. His behavior is normal. Judgment and thought content normal.  Nursing note and vitals reviewed.   BP (!) 156/75   Pulse 62   Temp 98 F (36.7 C) (Temporal)   Ht 5' 10"  (1.778 m)   Wt 222 lb 9.6 oz (101 kg)   SpO2 97%   BMI 31.94 kg/m  Wt Readings from Last 3 Encounters:  11/11/19 222 lb 9.6 oz (101 kg)  10/14/19 226 lb 12.8 oz (102.9 kg)  07/04/19 221 lb 3.2 oz (100.3 kg)     Health Maintenance Due  Topic Date Due  . Hepatitis C Screening  Never done  . HIV  Screening  Never done  . COLONOSCOPY  Never done    There are no preventive care reminders to display for this patient.  No results found for: TSH No results found for: WBC, HGB, HCT, MCV, PLT No results found for: NA, K, CHLORIDE, CO2, GLUCOSE, BUN, CREATININE, BILITOT, ALKPHOS, AST, ALT, PROT, ALBUMIN, CALCIUM, ANIONGAP, EGFR, GFR No results found for: CHOL No results found for: HDL No results found for: LDLCALC No results found for: TRIG No results found for: CHOLHDL No results found for: HGBA1C    Assessment & Plan:   Problem List Items Addressed This Visit    None    Visit Diagnoses    Screening for endocrine, metabolic and immunity disorder    -  Primary   Relevant Orders   CBC With Differential   TSH   Comprehensive metabolic panel   Hemoglobin A1c   Lipid screening       Relevant Orders   Lipid panel   Special screening for malignant neoplasms, colon       Relevant Orders   Ambulatory referral to Gastroenterology      No orders of the defined types were placed in this encounter.   Follow-up: Return in about 6 months (around 05/13/2020) for t2dm check.   PLAN  Unremarkable exam  Pt does not need medication refills at this time, but will contact our office when he does   Pt due for colonoscopy, referral sent  Labs drawn today  If A1c stays below 8.0, we can follow up in 6 mos. Otherwise we may need to follow up in 3 mo  Patient encouraged to call  clinic with any questions, comments, or concerns.  Maximiano Coss, NP

## 2019-11-12 LAB — COMPREHENSIVE METABOLIC PANEL
ALT: 26 IU/L (ref 0–44)
AST: 20 IU/L (ref 0–40)
Albumin/Globulin Ratio: 2 (ref 1.2–2.2)
Albumin: 4.9 g/dL — ABNORMAL HIGH (ref 3.8–4.8)
Alkaline Phosphatase: 59 IU/L (ref 39–117)
BUN/Creatinine Ratio: 13 (ref 10–24)
BUN: 22 mg/dL (ref 8–27)
Bilirubin Total: 0.5 mg/dL (ref 0.0–1.2)
CO2: 24 mmol/L (ref 20–29)
Calcium: 10.6 mg/dL — ABNORMAL HIGH (ref 8.6–10.2)
Chloride: 99 mmol/L (ref 96–106)
Creatinine, Ser: 1.64 mg/dL — ABNORMAL HIGH (ref 0.76–1.27)
GFR calc Af Amer: 50 mL/min/{1.73_m2} — ABNORMAL LOW (ref >59)
GFR calc non Af Amer: 44 mL/min/{1.73_m2} — ABNORMAL LOW (ref >59)
Globulin, Total: 2.5 g/dL (ref 1.5–4.5)
Glucose: 119 mg/dL — ABNORMAL HIGH (ref 65–99)
Potassium: 5.2 mmol/L (ref 3.5–5.2)
Sodium: 141 mmol/L (ref 134–144)
Total Protein: 7.4 g/dL (ref 6.0–8.5)

## 2019-11-12 LAB — CBC WITH DIFFERENTIAL
Basophils Absolute: 0.1 10*3/uL (ref 0.0–0.2)
Basos: 1 %
EOS (ABSOLUTE): 0.2 10*3/uL (ref 0.0–0.4)
Eos: 3 %
Hematocrit: 55.3 % — ABNORMAL HIGH (ref 37.5–51.0)
Hemoglobin: 18.3 g/dL — ABNORMAL HIGH (ref 13.0–17.7)
Immature Grans (Abs): 0 10*3/uL (ref 0.0–0.1)
Immature Granulocytes: 0 %
Lymphocytes Absolute: 2.3 10*3/uL (ref 0.7–3.1)
Lymphs: 25 %
MCH: 29 pg (ref 26.6–33.0)
MCHC: 33.1 g/dL (ref 31.5–35.7)
MCV: 88 fL (ref 79–97)
Monocytes Absolute: 0.6 10*3/uL (ref 0.1–0.9)
Monocytes: 6 %
Neutrophils Absolute: 6.1 10*3/uL (ref 1.4–7.0)
Neutrophils: 65 %
RBC: 6.31 x10E6/uL — ABNORMAL HIGH (ref 4.14–5.80)
RDW: 14.6 % (ref 11.6–15.4)
WBC: 9.2 10*3/uL (ref 3.4–10.8)

## 2019-11-12 LAB — LIPID PANEL
Chol/HDL Ratio: 4 ratio (ref 0.0–5.0)
Cholesterol, Total: 149 mg/dL (ref 100–199)
HDL: 37 mg/dL — ABNORMAL LOW (ref >39)
LDL Chol Calc (NIH): 77 mg/dL (ref 0–99)
Triglycerides: 211 mg/dL — ABNORMAL HIGH (ref 0–149)
VLDL Cholesterol Cal: 35 mg/dL (ref 5–40)

## 2019-11-12 LAB — HEMOGLOBIN A1C
Est. average glucose Bld gHb Est-mCnc: 189 mg/dL
Hgb A1c MFr Bld: 8.2 % — ABNORMAL HIGH (ref 4.8–5.6)

## 2019-11-12 LAB — TSH: TSH: 1.2 u[IU]/mL (ref 0.450–4.500)

## 2019-11-14 DIAGNOSIS — N1832 Chronic kidney disease, stage 3b: Secondary | ICD-10-CM | POA: Insufficient documentation

## 2019-11-14 DIAGNOSIS — E1121 Type 2 diabetes mellitus with diabetic nephropathy: Secondary | ICD-10-CM | POA: Diagnosis not present

## 2019-11-14 DIAGNOSIS — N183 Chronic kidney disease, stage 3 unspecified: Secondary | ICD-10-CM | POA: Diagnosis not present

## 2019-11-14 DIAGNOSIS — I129 Hypertensive chronic kidney disease with stage 1 through stage 4 chronic kidney disease, or unspecified chronic kidney disease: Secondary | ICD-10-CM | POA: Diagnosis not present

## 2019-11-30 ENCOUNTER — Encounter: Payer: BC Managed Care – PPO | Admitting: Registered Nurse

## 2019-12-01 ENCOUNTER — Telehealth: Payer: Self-pay | Admitting: Registered Nurse

## 2019-12-01 NOTE — Telephone Encounter (Signed)
Medication Refill - Medication: metFORMIN (GLUCOPHAGE) 1000 MG tablet - Pt takes 2 a day, needs 180 capsules fenofibrate (TRICOR) 145 MG tablet  glimepiride (AMARYL) 2 MG tablet  INVOKANA 100 MG TABS tablet  amLODipine-benazepril (LOTREL) 5-20 MG capsule  atorvastatin (LIPITOR) 20 MG tablet   90 DAY Supply for all   Has the patient contacted their pharmacy? Yes.   (Agent: If no, request that the patient contact the pharmacy for the refill.) (Agent: If yes, when and what did the pharmacy advise?)  Preferred Pharmacy (with phone number or street name):  [facility] # [id number] [place]  [address] [place] [address]  Phone: [phone] Fax: [phone]     Agent: Please be advised that RX refills may take up to 3 business days. We ask that you follow-up with your pharmacy.

## 2019-12-01 NOTE — Telephone Encounter (Signed)
Pt is requesting a 90 day supply on all these medications. Is it ok, if not which ones do you want a 90 supply on.  Please Advise.

## 2019-12-06 ENCOUNTER — Ambulatory Visit: Payer: Self-pay | Attending: Internal Medicine

## 2019-12-06 ENCOUNTER — Other Ambulatory Visit: Payer: Self-pay

## 2019-12-06 DIAGNOSIS — Z23 Encounter for immunization: Secondary | ICD-10-CM

## 2019-12-06 MED ORDER — AMLODIPINE BESY-BENAZEPRIL HCL 5-20 MG PO CAPS: 1.0000 | ORAL_CAPSULE | Freq: Every day | ORAL | 1 refills | Status: DC

## 2019-12-06 MED ORDER — FENOFIBRATE 145 MG PO TABS: ORAL_TABLET | ORAL | 1 refills | Status: DC

## 2019-12-06 MED ORDER — INVOKANA 100 MG PO TABS: 100.0000 mg | ORAL_TABLET | Freq: Every day | ORAL | 1 refills | Status: DC

## 2019-12-06 MED ORDER — METFORMIN HCL 1000 MG PO TABS: ORAL_TABLET | ORAL | 1 refills | Status: DC

## 2019-12-06 MED ORDER — ATORVASTATIN CALCIUM 20 MG PO TABS: 20.0000 mg | ORAL_TABLET | Freq: Every day | ORAL | 1 refills | Status: DC

## 2019-12-06 MED ORDER — GLIMEPIRIDE 2 MG PO TABS: ORAL_TABLET | ORAL | 1 refills | Status: DC

## 2019-12-06 NOTE — Telephone Encounter (Signed)
Ok to send all as 1 dya supply  Thank you  Kathrin Ruddy, NP

## 2019-12-06 NOTE — Progress Notes (Signed)
   Covid-19 Vaccination Clinic  Name:  Samuel Sanders    MRN: [id number] DOB: 27-Dec-1955  12/06/2019  [name] was observed post Covid-19 immunization for 15 minutes without incident. He was provided with Vaccine Information Sheet and instruction to access the V-Safe system.   [name] was instructed to call 911 with any severe reactions post vaccine: Marland Kitchen Difficulty breathing  . Swelling of face and throat  . A fast heartbeat  . A bad rash all over body  . Dizziness and weakness   Immunizations Administered    Name Date Dose VIS Date Route   Pfizer COVID-19 Vaccine 12/06/2019  1:47 PM 0.3 mL 08/12/2019 Intramuscular   Manufacturer: [place]   Lot: [id number]   [place]: [id number]

## 2019-12-07 ENCOUNTER — Telehealth: Payer: Self-pay | Admitting: Registered Nurse

## 2019-12-07 ENCOUNTER — Other Ambulatory Visit: Payer: Self-pay

## 2019-12-07 MED ORDER — ALLOPURINOL 100 MG PO TABS: ORAL_TABLET | ORAL | 0 refills | Status: DC

## 2019-12-07 NOTE — Telephone Encounter (Signed)
Pharmacy called and is needing clarification of directions on patients glimepiride. Please advise.      [facility] # [place], [place]  [place] [address]  Phone: [phone] Fax: [phone]  Not a 24 hour pharmacy; exact hours not known.

## 2019-12-07 NOTE — Telephone Encounter (Signed)
Spoke with the Pharmacy and sent all other medication refills in.

## 2019-12-14 ENCOUNTER — Encounter: Payer: Self-pay | Admitting: Internal Medicine

## 2019-12-20 ENCOUNTER — Other Ambulatory Visit: Payer: Self-pay

## 2019-12-20 ENCOUNTER — Ambulatory Visit: Payer: BC Managed Care – PPO | Admitting: Registered Nurse

## 2019-12-20 ENCOUNTER — Encounter: Payer: Self-pay | Admitting: Registered Nurse

## 2019-12-20 VITALS — BP 132/73 | HR 63 | Temp 98.0°F | Resp 16 | Ht 70.0 in | Wt 222.6 lb

## 2019-12-20 DIAGNOSIS — L0293 Carbuncle, unspecified: Secondary | ICD-10-CM | POA: Diagnosis not present

## 2019-12-20 MED ORDER — DOXYCYCLINE HYCLATE 100 MG PO TABS: 100.0000 mg | ORAL_TABLET | Freq: Two times a day (BID) | ORAL | 0 refills | Status: DC

## 2019-12-20 NOTE — Patient Instructions (Signed)
° ° ° °  If you have lab work done today you will be contacted with your lab results within the next 2 weeks.  If you have not heard from us then please contact us. The fastest way to get your results is to register for My Chart. ° ° °IF you received an x-ray today, you will receive an invoice from [facility]. Please contact [facility] at [phone] with questions or concerns regarding your invoice.  ° °IF you received labwork today, you will receive an invoice from [facility]. Please contact [facility] at [phone] with questions or concerns regarding your invoice.  ° °Our billing staff will not be able to assist you with questions regarding bills from these companies. ° °You will be contacted with the lab results as soon as they are available. The fastest way to get your results is to activate your My Chart account. Instructions are located on the last page of this paperwork. If you have not heard from us regarding the results in 2 weeks, please contact this office. °  ° ° ° °

## 2019-12-20 NOTE — Progress Notes (Signed)
Acute Office Visit  Subjective:    Patient ID: Samuel Sanders, male    DOB: [birth year], 64 y.o.   MRN: [id number]  Chief Complaint  Patient presents with  . Pain    patient states he have been having some nose pain for about 10 days . Per patient its like a pimple that has not came to a head , and tender to touch.    HPI Patient is in today for rash on nose Present for about 1 week. Red and painful. Feels swollen and firm. Not sure if feeling warm Feels like "pimple that won't pop" No drainage or clear head to it. No discharge inside nose. No URI symptoms   Past Medical History:  Diagnosis Date  . Essential hypertension   . Hyperlipidemia   . Type 2 diabetes mellitus (HCC)     No past surgical history on file.  Family History  Problem Relation Age of Onset  . Cancer Mother   . Healthy Father   . Brain cancer Sister     Social History   Socioeconomic History  . Marital status: Married    Spouse name: Not on file  . Number of children: Not on file  . Years of education: Not on file  . Highest education level: Not on file  Occupational History  . Not on file  Tobacco Use  . Smoking status: Former Smoker    Quit date: 06/28/2004    Years since quitting: 15.4  . Smokeless tobacco: Never Used  Substance and Sexual Activity  . Alcohol use: Not Currently  . Drug use: Not Currently  . Sexual activity: Yes  Other Topics Concern  . Not on file  Social History Narrative  . Not on file   Social Determinants of Health   Financial Resource Strain:   . Difficulty of Paying Living Expenses:   Food Insecurity:   . Worried About Charity fundraiser in the Last Year:   . Arboriculturist in the Last Year:   Transportation Needs:   . Film/video editor (Medical):   Marland Kitchen Lack of Transportation (Non-Medical):   Physical Activity:   . Days of Exercise per Week:   . Minutes of Exercise per Session:   Stress:   . Feeling of Stress :   Social Connections:   .  Frequency of Communication with Friends and Family:   . Frequency of Social Gatherings with Friends and Family:   . Attends Religious Services:   . Active Member of Clubs or Organizations:   . Attends Archivist Meetings:   Marland Kitchen Marital Status:   Intimate Partner Violence:   . Fear of Current or Ex-Partner:   . Emotionally Abused:   Marland Kitchen Physically Abused:   . Sexually Abused:     Outpatient Medications Prior to Visit  Medication Sig Dispense Refill  . allopurinol (ZYLOPRIM) 100 MG tablet allopurinol 100 mg tablet 60 tablet 0  . amLODipine-benazepril (LOTREL) 5-20 MG capsule Take 1 capsule by mouth daily. 90 capsule 1  . atorvastatin (LIPITOR) 20 MG tablet Take 1 tablet (20 mg total) by mouth daily. 90 tablet 1  . fenofibrate (TRICOR) 145 MG tablet TAKE 1 TABLET ONCE A DAY ORALLY 90 tablet 1  . glimepiride (AMARYL) 2 MG tablet glimepiride 2 mg tablet 90 tablet 1  . INVOKANA 100 MG TABS tablet Take 1 tablet (100 mg total) by mouth daily. 90 tablet 1  . metFORMIN (GLUCOPHAGE) 1000 MG tablet TAKE 1/2  TABLET BY MOUTH EVERY MORNING AND 1 & 1/2 TABLETS IN THE EVENING 180 tablet 1  . Omega-3 1000 MG CAPS Take by mouth.    . testosterone cypionate (DEPOTESTOSTERONE CYPIONATE) 200 MG/ML injection Inject 200 mg into the muscle once a week.    . Travoprost, BAK Free, (TRAVATAN Z) 0.004 % SOLN ophthalmic solution PUT 1 DROP INTO BOTH EYES AT BEDTIME AS DIRECTED     No facility-administered medications prior to visit.    No Known Allergies  Review of Systems  Constitutional: Negative.   HENT: Negative.   Eyes: Negative.   Respiratory: Negative.   Cardiovascular: Negative.   Gastrointestinal: Negative.   Endocrine: Negative.   Genitourinary: Negative.   Musculoskeletal: Negative.   Skin: Positive for rash. Negative for color change, pallor and wound.  Allergic/Immunologic: Negative.   Neurological: Negative.   Hematological: Negative.   Psychiatric/Behavioral: Negative.   All  other systems reviewed and are negative.      Objective:    Physical Exam Vitals and nursing note reviewed.  Constitutional:      General: He is not in acute distress.    Appearance: Normal appearance. He is normal weight. He is not ill-appearing, toxic-appearing or diaphoretic.  Cardiovascular:     Rate and Rhythm: Normal rate and regular rhythm.  Pulmonary:     Effort: Pulmonary effort is normal. No respiratory distress.  Skin:    General: Skin is cool and dry.     Capillary Refill: Capillary refill takes less than 2 seconds.       Neurological:     General: No focal deficit present.     Mental Status: He is alert and oriented to person, place, and time. Mental status is at baseline.  Psychiatric:        Mood and Affect: Mood normal.        Behavior: Behavior normal.        Thought Content: Thought content normal.        Judgment: Judgment normal.     BP 132/73   Pulse 63   Temp 98 F (36.7 C) (Temporal)   Resp 16   Ht 5\' 10"  (1.778 m)   Wt 222 lb 9.6 oz (101 kg)   SpO2 97%   BMI 31.94 kg/m  Wt Readings from Last 3 Encounters:  12/20/19 222 lb 9.6 oz (101 kg)  11/11/19 222 lb 9.6 oz (101 kg)  10/14/19 226 lb 12.8 oz (102.9 kg)    There are no preventive care reminders to display for this patient.  There are no preventive care reminders to display for this patient.   Lab Results  Component Value Date   TSH 1.200 11/11/2019   Lab Results  Component Value Date   WBC 9.2 11/11/2019   HGB 18.3 (H) 11/11/2019   HCT 55.3 (H) 11/11/2019   MCV 88 11/11/2019   Lab Results  Component Value Date   NA 141 11/11/2019   K 5.2 11/11/2019   CO2 24 11/11/2019   GLUCOSE 119 (H) 11/11/2019   BUN 22 11/11/2019   CREATININE 1.64 (H) 11/11/2019   BILITOT 0.5 11/11/2019   ALKPHOS 59 11/11/2019   AST 20 11/11/2019   ALT 26 11/11/2019   PROT 7.4 11/11/2019   ALBUMIN 4.9 (H) 11/11/2019   CALCIUM 10.6 (H) 11/11/2019   Lab Results  Component Value Date   CHOL 149  11/11/2019   Lab Results  Component Value Date   HDL 37 (L) 11/11/2019   Lab Results  Component Value Date   LDLCALC 77 11/11/2019   Lab Results  Component Value Date   TRIG 211 (H) 11/11/2019   Lab Results  Component Value Date   CHOLHDL 4.0 11/11/2019   Lab Results  Component Value Date   HGBA1C 8.2 (H) 11/11/2019       Assessment & Plan:   Problem List Items Addressed This Visit    None    Visit Diagnoses    Carbuncle    -  Primary   Relevant Medications   doxycycline (VIBRA-TABS) 100 MG tablet       No orders of the defined types were placed in this encounter.  PLAN  Likely carbuncle vs folliculitis   Doxycycline 100mg  PO bid for 7 days  May apply thin, thin layer of hydrocortisone once or twice daily  Ice, hygiene, other nonpharm reviewed  Patient encouraged to call clinic with any questions, comments, or concerns.  Maximiano Coss, NP

## 2020-01-06 ENCOUNTER — Encounter: Payer: Self-pay | Admitting: Registered Nurse

## 2020-01-18 DIAGNOSIS — R948 Abnormal results of function studies of other organs and systems: Secondary | ICD-10-CM | POA: Diagnosis not present

## 2020-01-20 ENCOUNTER — Other Ambulatory Visit: Payer: Self-pay

## 2020-01-20 ENCOUNTER — Ambulatory Visit (AMBULATORY_SURGERY_CENTER): Payer: Self-pay

## 2020-01-20 ENCOUNTER — Encounter: Payer: Self-pay | Admitting: Internal Medicine

## 2020-01-20 VITALS — Ht 70.0 in | Wt 223.0 lb

## 2020-01-20 DIAGNOSIS — Z1211 Encounter for screening for malignant neoplasm of colon: Secondary | ICD-10-CM

## 2020-01-20 NOTE — Progress Notes (Signed)
No egg or soy allergy known to patient  No issues with past sedation with any surgeries  or procedures, no intubation problems  No diet pills per patient No home 02 use per patient  No blood thinners per patient  Pt denies issues with constipation  No A fib or A flutter  EMMI video sent to pt's e mail   Pt has completed covid vaccine series.  Due to the COVID-19 pandemic we are asking patients to follow these guidelines. Please only bring one care partner. Please be aware that your care partner may wait in the car in the parking lot or if they feel like they will be too hot to wait in the car, they may wait in the lobby on the 4th floor. All care partners are required to wear a mask the entire time (we do not have any that we can provide them), they need to practice social distancing, and we will do a Covid check for all patient's and care partners when you arrive. Also we will check their temperature and your temperature. If the care partner waits in their car they need to stay in the parking lot the entire time and we will call them on their cell phone when the patient is ready for discharge so they can bring the car to the front of the building. Also all patient's will need to wear a mask into building.

## 2020-01-31 DIAGNOSIS — Z860101 Personal history of adenomatous and serrated colon polyps: Secondary | ICD-10-CM

## 2020-01-31 DIAGNOSIS — Z86010 Personal history of colonic polyps: Secondary | ICD-10-CM

## 2020-01-31 HISTORY — DX: Personal history of adenomatous and serrated colon polyps: Z86.0101

## 2020-01-31 HISTORY — DX: Personal history of colonic polyps: Z86.010

## 2020-02-03 ENCOUNTER — Other Ambulatory Visit: Payer: Self-pay

## 2020-02-03 ENCOUNTER — Encounter: Payer: Self-pay | Admitting: Internal Medicine

## 2020-02-03 ENCOUNTER — Ambulatory Visit (AMBULATORY_SURGERY_CENTER): Payer: BC Managed Care – PPO | Admitting: Internal Medicine

## 2020-02-03 VITALS — BP 113/56 | HR 60 | Temp 97.5°F | Resp 14 | Ht 70.0 in | Wt 223.0 lb

## 2020-02-03 DIAGNOSIS — D123 Benign neoplasm of transverse colon: Secondary | ICD-10-CM

## 2020-02-03 DIAGNOSIS — Z86010 Personal history of colonic polyps: Secondary | ICD-10-CM | POA: Diagnosis not present

## 2020-02-03 DIAGNOSIS — Z1211 Encounter for screening for malignant neoplasm of colon: Secondary | ICD-10-CM

## 2020-02-03 DIAGNOSIS — D120 Benign neoplasm of cecum: Secondary | ICD-10-CM | POA: Diagnosis not present

## 2020-02-03 MED ORDER — SODIUM CHLORIDE 0.9 % IV SOLN: 500.0000 mL | Freq: Once | INTRAVENOUS | Status: DC

## 2020-02-03 NOTE — Op Note (Signed)
[facility] Patient Name: Samuel Sanders Procedure Date: 02/03/2020 8:36 AM MRN: [id number] Endoscopist: Gatha Mayer , MD Age: 64 Referring MD:  Date of Birth: October 27, 1955 Gender: Male Account #: [id number] Procedure:                Colonoscopy Indications:              Screening for colorectal malignant neoplasm Medicines:                Propofol per Anesthesia, Monitored Anesthesia Care Procedure:                Pre-Anesthesia Assessment:                           - Prior to the procedure, a History and Physical                            was performed, and patient medications and                            allergies were reviewed. The patient's tolerance of                            previous anesthesia was also reviewed. The risks                            and benefits of the procedure and the sedation                            options and risks were discussed with the patient.                            All questions were answered, and informed consent                            was obtained. Prior Anticoagulants: The patient has                            taken no previous anticoagulant or antiplatelet                            agents. ASA Grade Assessment: II - A patient with                            mild systemic disease. After reviewing the risks                            and benefits, the patient was deemed in                            satisfactory condition to undergo the procedure.                           After obtaining informed consent, the colonoscope  was passed under direct vision. Throughout the                            procedure, the patient's blood pressure, pulse, and                            oxygen saturations were monitored continuously. The                            Colonoscope was introduced through the anus and                            advanced to the the cecum, identified by   appendiceal orifice and ileocecal valve. The                            colonoscopy was performed without difficulty. The                            patient tolerated the procedure well. The quality                            of the bowel preparation was good. The bowel                            preparation used was Miralax via split dose                            instruction. The ileocecal valve, appendiceal                            orifice, and rectum were photographed. Scope In: 8:42:58 AM Scope Out: 9:02:07 AM Scope Withdrawal Time: 0 hours 17 minutes 32 seconds  Total Procedure Duration: 0 hours 19 minutes 9 seconds  Findings:                 The perianal and digital rectal examinations were                            normal. Pertinent negatives include normal prostate                            (size, shape, and consistency).                           Three sessile polyps were found in the transverse                            colon and ileocecal valve. The polyps were                            diminutive in size. These polyps were removed with                            a cold snare. Resection  and retrieval were                            complete. Verification of patient identification                            for the specimen was done. Estimated blood loss was                            minimal.                           Multiple diverticula were found in the sigmoid                            colon.                           The exam was otherwise without abnormality on                            direct and retroflexion views. Complications:            No immediate complications. Estimated Blood Loss:     Estimated blood loss was minimal. Impression:               - Three diminutive polyps in the transverse colon                            and at the ileocecal valve, removed with a cold                            snare. Resected and retrieved.                            - Diverticulosis in the sigmoid colon.                           - The examination was otherwise normal on direct                            and retroflexion views. Recommendation:           - Patient has a contact number available for                            emergencies. The signs and symptoms of potential                            delayed complications were discussed with the                            patient. Return to normal activities tomorrow.                            Written discharge instructions were provided to the  patient.                           - Resume previous diet.                           - Continue present medications.                           - Repeat colonoscopy is recommended. The                            colonoscopy date will be determined after pathology                            results from today's exam become available for                            review. Gatha Mayer, MD 02/03/2020 9:12:35 AM This report has been signed electronically.

## 2020-02-03 NOTE — Progress Notes (Signed)
pt tolerated well. VSS. awake and to recovery. Report given to RN.  

## 2020-02-03 NOTE — Patient Instructions (Addendum)
I found and removed 3 tiny polyps. You also have a condition called diverticulosis - common and not usually a problem. Please read the handout provided.  I will let you know pathology results and when to have another routine colonoscopy by mail and/or My Chart.  I appreciate the opportunity to care for you. Gatha Mayer, MD, FACG    YOU HAD AN ENDOSCOPIC PROCEDURE TODAY AT [facility]:   Refer to the procedure report that was given to you for any specific questions about what was found during the examination.  If the procedure report does not answer your questions, please call your gastroenterologist to clarify.  If you requested that your care partner not be given the details of your procedure findings, then the procedure report has been included in a sealed envelope for you to review at your convenience later.  YOU SHOULD EXPECT: Some feelings of bloating in the abdomen. Passage of more gas than usual.  Walking can help get rid of the air that was put into your GI tract during the procedure and reduce the bloating. If you had a lower endoscopy (such as a colonoscopy or flexible sigmoidoscopy) you may notice spotting of blood in your stool or on the toilet paper. If you underwent a bowel prep for your procedure, you may not have a normal bowel movement for a few days.  Please Note:  You might notice some irritation and congestion in your nose or some drainage.  This is from the oxygen used during your procedure.  There is no need for concern and it should clear up in a day or so.  SYMPTOMS TO REPORT IMMEDIATELY:   Following lower endoscopy (colonoscopy or flexible sigmoidoscopy):  Excessive amounts of blood in the stool  Significant tenderness or worsening of abdominal pains  Swelling of the abdomen that is new, acute  Fever of 100F or higher  For urgent or emergent issues, a gastroenterologist can be reached at any hour by calling [phone]. Do not use MyChart  messaging for urgent concerns.    DIET:  We do recommend a small meal at first, but then you may proceed to your regular diet.  Drink plenty of fluids but you should avoid alcoholic beverages for 24 hours.  ACTIVITY:  You should plan to take it easy for the rest of today and you should NOT DRIVE or use heavy machinery until tomorrow (because of the sedation medicines used during the test).    FOLLOW UP: Our staff will call the number listed on your records 48-72 hours following your procedure to check on you and address any questions or concerns that you may have regarding the information given to you following your procedure. If we do not reach you, we will leave a message.  We will attempt to reach you two times.  During this call, we will ask if you have developed any symptoms of COVID 19. If you develop any symptoms (ie: fever, flu-like symptoms, shortness of breath, cough etc.) before then, please call [phone].  If you test positive for Covid 19 in the 2 weeks post procedure, please call and report this information to [place].    If any biopsies were taken you will be contacted by phone or by letter within the next 1-3 weeks.  Please call us at [phone] if you have not heard about the biopsies in 3 weeks.    SIGNATURES/CONFIDENTIALITY: You and/or your care partner have signed paperwork which will be entered  into your electronic medical record.  These signatures attest to the fact that that the information above on your After Visit Summary has been reviewed and is understood.  Full responsibility of the confidentiality of this discharge information lies with you and/or your care-partner.

## 2020-02-03 NOTE — Progress Notes (Signed)
Pt's states no medical or surgical changes since previsit or office visit.   VS taken by DT 

## 2020-02-03 NOTE — Progress Notes (Signed)
Called to room to assist during endoscopic procedure.  Patient ID and intended procedure confirmed with present staff. Received instructions for my participation in the procedure from the performing physician.  

## 2020-02-07 ENCOUNTER — Telehealth: Payer: Self-pay

## 2020-02-07 NOTE — Telephone Encounter (Signed)
  Follow up Call-  Call back number 02/03/2020  Post procedure Call Back phone  # [phone]  Permission to leave phone message Yes     Patient questions:  Do you have a fever, pain , or abdominal swelling? No. Pain Score  0 *  Have you tolerated food without any problems? Yes.    Have you been able to return to your normal activities? Yes.    Do you have any questions about your discharge instructions: Diet   No. Medications  No. Follow up visit  No.  Do you have questions or concerns about your Care? No.  Actions: * If pain score is 4 or above: No action needed, pain <4.  1. Have you developed a fever since your procedure? no  2.   Have you had an respiratory symptoms (SOB or cough) since your procedure? no  3.   Have you tested positive for COVID 19 since your procedure no  4.   Have you had any family members/close contacts diagnosed with the COVID 19 since your procedure?  no   If yes to any of these questions please route to Joylene John, RN and Erenest Rasher, RN

## 2020-02-07 NOTE — Telephone Encounter (Signed)
No answer, left message to call back later today, B.Lolitha Tortora RN. 

## 2020-02-12 ENCOUNTER — Encounter: Payer: Self-pay | Admitting: Internal Medicine

## 2020-02-14 ENCOUNTER — Other Ambulatory Visit: Payer: Self-pay | Admitting: Registered Nurse

## 2020-02-15 MED ORDER — ALLOPURINOL 100 MG PO TABS: ORAL_TABLET | ORAL | 0 refills | Status: DC

## 2020-02-16 ENCOUNTER — Other Ambulatory Visit: Payer: Self-pay

## 2020-02-16 DIAGNOSIS — M109 Gout, unspecified: Secondary | ICD-10-CM

## 2020-02-16 MED ORDER — ALLOPURINOL 100 MG PO TABS: 100.0000 mg | ORAL_TABLET | Freq: Every day | ORAL | 1 refills | Status: DC

## 2020-03-07 DIAGNOSIS — M5416 Radiculopathy, lumbar region: Secondary | ICD-10-CM | POA: Diagnosis not present

## 2020-03-07 DIAGNOSIS — M25551 Pain in right hip: Secondary | ICD-10-CM | POA: Diagnosis not present

## 2020-03-19 DIAGNOSIS — M25551 Pain in right hip: Secondary | ICD-10-CM | POA: Diagnosis not present

## 2020-03-29 ENCOUNTER — Emergency Department (HOSPITAL_COMMUNITY): Payer: BC Managed Care – PPO

## 2020-03-29 ENCOUNTER — Emergency Department (HOSPITAL_COMMUNITY)
Admission: EM | Admit: 2020-03-29 | Discharge: 2020-03-29 | Disposition: A | Discharge status: Home / Self Care | Payer: BC Managed Care – PPO | Attending: Emergency Medicine | Admitting: Emergency Medicine

## 2020-03-29 ENCOUNTER — Other Ambulatory Visit: Payer: Self-pay

## 2020-03-29 ENCOUNTER — Encounter (HOSPITAL_COMMUNITY): Payer: Self-pay

## 2020-03-29 DIAGNOSIS — N183 Chronic kidney disease, stage 3 unspecified: Secondary | ICD-10-CM | POA: Insufficient documentation

## 2020-03-29 DIAGNOSIS — Z7984 Long term (current) use of oral hypoglycemic drugs: Secondary | ICD-10-CM | POA: Diagnosis not present

## 2020-03-29 DIAGNOSIS — Z7982 Long term (current) use of aspirin: Secondary | ICD-10-CM | POA: Insufficient documentation

## 2020-03-29 DIAGNOSIS — Z79899 Other long term (current) drug therapy: Secondary | ICD-10-CM | POA: Diagnosis not present

## 2020-03-29 DIAGNOSIS — R109 Unspecified abdominal pain: Secondary | ICD-10-CM | POA: Diagnosis not present

## 2020-03-29 DIAGNOSIS — R911 Solitary pulmonary nodule: Secondary | ICD-10-CM | POA: Diagnosis not present

## 2020-03-29 DIAGNOSIS — Z87891 Personal history of nicotine dependence: Secondary | ICD-10-CM | POA: Insufficient documentation

## 2020-03-29 DIAGNOSIS — E1122 Type 2 diabetes mellitus with diabetic chronic kidney disease: Secondary | ICD-10-CM | POA: Diagnosis not present

## 2020-03-29 DIAGNOSIS — I129 Hypertensive chronic kidney disease with stage 1 through stage 4 chronic kidney disease, or unspecified chronic kidney disease: Secondary | ICD-10-CM | POA: Insufficient documentation

## 2020-03-29 LAB — COMPREHENSIVE METABOLIC PANEL
ALT: 32 U/L (ref 0–44)
AST: 23 U/L (ref 15–41)
Albumin: 4.2 g/dL (ref 3.5–5.0)
Alkaline Phosphatase: 42 U/L (ref 38–126)
Anion gap: 10 (ref 5–15)
BUN: 23 mg/dL (ref 8–23)
CO2: 25 mmol/L (ref 22–32)
Calcium: 9.9 mg/dL (ref 8.9–10.3)
Chloride: 105 mmol/L (ref 98–111)
Creatinine, Ser: 1.37 mg/dL — ABNORMAL HIGH (ref 0.61–1.24)
GFR calc Af Amer: >60 mL/min (ref >60)
GFR calc non Af Amer: 54 mL/min — ABNORMAL LOW (ref >60)
Glucose, Bld: 89 mg/dL (ref 70–99)
Potassium: 4.5 mmol/L (ref 3.5–5.1)
Sodium: 140 mmol/L (ref 135–145)
Total Bilirubin: 0.6 mg/dL (ref 0.3–1.2)
Total Protein: 7 g/dL (ref 6.5–8.1)

## 2020-03-29 LAB — URINALYSIS, ROUTINE W REFLEX MICROSCOPIC
Bacteria, UA: NONE SEEN
Bilirubin Urine: NEGATIVE
Glucose, UA: >=500 mg/dL — AB
Hgb urine dipstick: NEGATIVE
Ketones, ur: NEGATIVE mg/dL
Leukocytes,Ua: NEGATIVE
Nitrite: NEGATIVE
Protein, ur: NEGATIVE mg/dL
Specific Gravity, Urine: 1.024 (ref 1.005–1.030)
pH: 5 (ref 5.0–8.0)

## 2020-03-29 LAB — CBC WITH DIFFERENTIAL/PLATELET
Abs Immature Granulocytes: 0.02 10*3/uL (ref 0.00–0.07)
Basophils Absolute: 0 10*3/uL (ref 0.0–0.1)
Basophils Relative: 1 %
Eosinophils Absolute: 0.1 10*3/uL (ref 0.0–0.5)
Eosinophils Relative: 2 %
HCT: 50 % (ref 39.0–52.0)
Hemoglobin: 16.8 g/dL (ref 13.0–17.0)
Immature Granulocytes: 0 %
Lymphocytes Relative: 28 %
Lymphs Abs: 2.2 10*3/uL (ref 0.7–4.0)
MCH: 29.5 pg (ref 26.0–34.0)
MCHC: 33.6 g/dL (ref 30.0–36.0)
MCV: 87.7 fL (ref 80.0–100.0)
Monocytes Absolute: 0.5 10*3/uL (ref 0.1–1.0)
Monocytes Relative: 6 %
Neutro Abs: 5.1 10*3/uL (ref 1.7–7.7)
Neutrophils Relative %: 63 %
Platelets: 184 10*3/uL (ref 150–400)
RBC: 5.7 MIL/uL (ref 4.22–5.81)
RDW: 13.1 % (ref 11.5–15.5)
WBC: 8 10*3/uL (ref 4.0–10.5)
nRBC: 0 % (ref 0.0–0.2)

## 2020-03-29 LAB — LIPASE, BLOOD: Lipase: 59 U/L — ABNORMAL HIGH (ref 11–51)

## 2020-03-29 MED ORDER — ACETAMINOPHEN 325 MG PO TABS
650.0000 mg | ORAL_TABLET | Freq: Once | ORAL | Status: AC
  Administered 2020-03-29: 650 mg via ORAL
  Filled 2020-03-29: qty 2

## 2020-03-29 MED ORDER — HYDROCODONE-ACETAMINOPHEN 5-325 MG PO TABS: 1.0000 | ORAL_TABLET | Freq: Four times a day (QID) | ORAL | 0 refills | Status: DC | PRN

## 2020-03-29 MED ORDER — SODIUM CHLORIDE 0.9 % IV BOLUS (SEPSIS)
500.0000 mL | Freq: Once | INTRAVENOUS | Status: AC
  Administered 2020-03-29: 500 mL via INTRAVENOUS

## 2020-03-29 MED ORDER — MORPHINE SULFATE (PF) 4 MG/ML IV SOLN: 4.0000 mg | Freq: Once | INTRAVENOUS | Status: DC

## 2020-03-29 MED ORDER — CYCLOBENZAPRINE HCL 10 MG PO TABS
10.0000 mg | ORAL_TABLET | Freq: Two times a day (BID) | ORAL | 0 refills | Status: DC | PRN
Start: 2020-03-29 — End: 2021-05-15

## 2020-03-29 MED ORDER — SODIUM CHLORIDE 0.9 % IV SOLN
1000.0000 mL | INTRAVENOUS | Status: DC
  Administered 2020-03-29: 1000 mL via INTRAVENOUS

## 2020-03-29 MED ORDER — ONDANSETRON HCL 4 MG/2ML IJ SOLN: 4.0000 mg | Freq: Once | INTRAMUSCULAR | Status: DC

## 2020-03-29 NOTE — ED Provider Notes (Signed)
[facility] DEPT Provider Note   CSN: [id number] Arrival date & time: 03/29/20  3875     History Chief Complaint  Patient presents with  . Flank Pain    Samuel Sanders is a 64 y.o. male.  HPI   Pt started having some pain in his left flank area yesterday but today the pain became very intense.  Sharp and stabbing.  It does not radiate.  No nausea or vomiting.  No trouble urinating.  No pain in legs.  No numbness or weakness.  NO abdominal pain.  Pt thinks hemight have a kidney stone.  Past Medical History:  Diagnosis Date  . Essential hypertension   . Hx of adenomatous colonic polyps 01/2020  . Hyperlipidemia   . Type 2 diabetes mellitus [facility])     Patient Active Problem List   Diagnosis Date Noted  . Stage 3b chronic kidney disease 11/14/2019  . Dupuytren's disease of palm 09/27/2018  . Pain in finger of left hand 09/27/2018  . Benign hypertension with CKD (chronic kidney disease) stage III 12/11/2015  . Type 2 diabetes mellitus with stage 3 chronic kidney disease, without long-term current use of insulin ([place]) 12/11/2015    Past Surgical History:  Procedure Laterality Date  . COLONOSCOPY  2009   normal  . KNEE ARTHROSCOPY Left 1983  . TENDON REPAIR Right 1990   wrist  . TONSILLECTOMY  1958       Family History  Problem Relation Age of Onset  . Cancer Mother   . Healthy Father   . Brain cancer Sister   . Colon cancer Neg Hx   . Colon polyps Neg Hx   . Esophageal cancer Neg Hx   . Rectal cancer Neg Hx   . Stomach cancer Neg Hx     Social History   Tobacco Use  . Smoking status: Former Smoker    Quit date: 06/28/2004    Years since quitting: 15.7  . Smokeless tobacco: Never Used  Vaping Use  . Vaping Use: Never used  Substance Use Topics  . Alcohol use: Yes    Comment: rare beer monthly or so  . Drug use: Not Currently    Home Medications Prior to Admission medications   Medication Sig Start Date End Date  Taking? Authorizing Provider  allopurinol (ZYLOPRIM) 100 MG tablet Take 1 tablet (100 mg total) by mouth daily. allopurinol 100 mg tablet 02/16/20   Maximiano Coss, NP  amLODipine-benazepril (LOTREL) 5-20 MG capsule Take 1 capsule by mouth daily. 12/06/19 03/05/20  Maximiano Coss, NP  aspirin EC 81 MG tablet Take 81 mg by mouth daily.    [provider]  atorvastatin (LIPITOR) 20 MG tablet Take 1 tablet (20 mg total) by mouth daily. 12/06/19   Maximiano Coss, NP  cyclobenzaprine (FLEXERIL) 10 MG tablet Take 1 tablet (10 mg total) by mouth 2 (two) times daily as needed for muscle spasms. 03/29/20   Dorie Rank, MD  doxycycline (VIBRA-TABS) 100 MG tablet Take 1 tablet (100 mg total) by mouth 2 (two) times daily. 12/20/19   Maximiano Coss, NP  fenofibrate (TRICOR) 145 MG tablet TAKE 1 TABLET ONCE A DAY ORALLY 12/06/19   Maximiano Coss, NP  glimepiride (AMARYL) 2 MG tablet glimepiride 2 mg tablet 12/06/19   Maximiano Coss, NP  HYDROcodone-acetaminophen (NORCO/VICODIN) 5-325 MG tablet Take 1 tablet by mouth every 6 (six) hours as needed. 03/29/20   Dorie Rank, MD  INVOKANA 100 MG TABS tablet Take 1 tablet (100  mg total) by mouth daily. 12/06/19   Maximiano Coss, NP  metFORMIN (GLUCOPHAGE) 1000 MG tablet TAKE 1/2 TABLET BY MOUTH EVERY MORNING AND 1 & 1/2 TABLETS IN THE EVENING 12/06/19   Maximiano Coss, NP  Omega-3 1000 MG CAPS Take by mouth.    [provider]  testosterone cypionate (DEPOTESTOSTERONE CYPIONATE) 200 MG/ML injection Inject 200 mg into the muscle once a week. 03/11/19   [provider]  Travoprost, BAK Free, (TRAVATAN Z) 0.004 % SOLN ophthalmic solution PUT 1 DROP INTO BOTH EYES AT BEDTIME AS DIRECTED 10/30/15   [provider]    Allergies    Patient has no known allergies.  Review of Systems   Review of Systems  All other systems reviewed and are negative.   Physical Exam Updated Vital Signs BP (!) 147/76 (BP Location: Right Arm)   Pulse 60   Temp 98 F  (36.7 C) (Oral)   Resp 20   Ht 1.778 m (5\' 10" )   Wt (!) 97.5 kg   SpO2 98%   BMI 30.85 kg/m   Physical Exam Vitals and nursing note reviewed.  Constitutional:      Appearance: He is well-developed. He is not ill-appearing or diaphoretic.  HENT:     Head: Normocephalic and atraumatic.     Right Ear: External ear normal.     Left Ear: External ear normal.  Eyes:     General: No scleral icterus.       Right eye: No discharge.        Left eye: No discharge.     Conjunctiva/sclera: Conjunctivae normal.  Neck:     Trachea: No tracheal deviation.  Cardiovascular:     Rate and Rhythm: Normal rate and regular rhythm.  Pulmonary:     Effort: Pulmonary effort is normal. No respiratory distress.     Breath sounds: Normal breath sounds. No stridor. No wheezing or rales.  Abdominal:     General: Bowel sounds are normal. There is no distension.     Palpations: Abdomen is soft.     Tenderness: There is no abdominal tenderness. There is no guarding or rebound.  Musculoskeletal:        General: No tenderness.     Cervical back: Neck supple.  Skin:    General: Skin is warm and dry.     Findings: No rash.  Neurological:     Mental Status: He is alert.     Cranial Nerves: No cranial nerve deficit (no facial droop, extraocular movements intact, no slurred speech).     Sensory: No sensory deficit.     Motor: No abnormal muscle tone or seizure activity.     Coordination: Coordination normal.     ED Results / Procedures / Treatments   Labs (all labs ordered are listed, but only abnormal results are displayed) Labs Reviewed  URINALYSIS, ROUTINE W REFLEX MICROSCOPIC - Abnormal; Notable for the following components:      Result Value   Glucose, UA >=500 (*)    All other components within normal limits  COMPREHENSIVE METABOLIC PANEL - Abnormal; Notable for the following components:   Creatinine, Ser 1.37 (*)    GFR calc non Af Amer 54 (*)    All other components within normal limits    LIPASE, BLOOD - Abnormal; Notable for the following components:   Lipase 59 (*)    All other components within normal limits  CBC WITH DIFFERENTIAL/PLATELET    EKG None  Radiology CT RENAL STONE  STUDY  Result Date: 03/29/2020 CLINICAL DATA:  LEFT flank pain EXAM: CT ABDOMEN AND PELVIS WITHOUT CONTRAST TECHNIQUE: Multidetector CT imaging of the abdomen and pelvis was performed following the standard protocol without IV contrast. COMPARISON:  None. FINDINGS: Lower chest: LEFT lower lobe pulmonary nodule measures 5 mm (series 4, image 24). Lingular atelectasis. Hepatobiliary: Hepatic steatosis. Indeterminate hypodense lesion of the LEFT hepatic dome is too small to accurately characterize. Cholelithiasis without evidence of cholecystitis. Pancreas: Unremarkable. No pancreatic ductal dilatation or surrounding inflammatory changes. Spleen: Normal in size without focal abnormality. Adrenals/Urinary Tract: Adrenal glands are unremarkable. No hydronephrosis. No renal calculi visualized. Subcentimeter hypodense lesion of the inner LEFT kidney is too small to accurately characterize. Bladder is unremarkable. Stomach/Bowel: Diverticulosis without evidence of diverticulitis. No evidence of bowel obstruction. The appendix is unremarkable. Vascular/Lymphatic: Moderate atherosclerotic calcifications of the aorta. No suspicious adenopathy. Reproductive: Coarse calcifications of the prostate. Other: No free fluid or free air. 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 Multilevel degenerative changes of the thoracolumbar spine. IMPRESSION: 1. No acute intra-abdominal or intrapelvic pathology. No findings to explain the patient's left flank pain. 2. Hepatic steatosis. 3. 5 mm LEFT lower lobe pulmonary nodule. No follow-up needed if patient is low-risk. Non-contrast chest CT can be considered in 12 months if patient is high-risk. This recommendation follows the consensus statement:  Guidelines for Management of Incidental Pulmonary Nodules Detected on CT Images: From the Fleischner Society 2017; Radiology 2017; 284:228-243. Aortic Atherosclerosis (ICD10-I70.0). Electronically Signed   By: Valentino Saxon MD   On: 03/29/2020 11:18    Procedures Procedures (including critical care time)  Medications Ordered in ED Medications  morphine 4 MG/ML injection 4 mg (4 mg Intravenous Refused 03/29/20 1024)  ondansetron (ZOFRAN) injection 4 mg (4 mg Intravenous Refused 03/29/20 1025)  sodium chloride 0.9 % bolus 500 mL (0 mLs Intravenous Stopped 03/29/20 1129)    Followed by  0.9 %  sodium chloride infusion (1,000 mLs Intravenous New Bag/Given 03/29/20 1031)  acetaminophen (TYLENOL) tablet 650 mg (650 mg Oral Given 03/29/20 1025)    ED Course  I have reviewed the triage vital signs and the nursing notes.  Pertinent labs & imaging results that were available during my care of the patient were reviewed by me and considered in my medical decision making (see chart for details).  Clinical Course as of Mar 30 1203  Thu Mar 29, 2020  1006 Morphine ordered.  Notified that pt requested something not as strong.   Tylenol ordered   [JK]    Clinical Course User Index [JK] Dorie Rank, MD   MDM Rules/Calculators/A&P                          Patient presented to ED for evaluation of left-sided flank pain patient was concerned about possible kidney stone.  Patient has been having some sharp intermittent pain but otherwise appeared comfortable in the ED.  ED work-up is reassuring.  Patient does have elevated creatinine but this is chronic for him.  CT scan does not show any acute findings.  No findings of kidney stone.  Pain may be musculoskeletal in nature.  Will discharge home with medications for pain and muscle relaxant.  We will give a short course of hydrocodone as the patient does have renal insufficiency.  Will avoid NSAIDs. Final Clinical Impression(s) / ED Diagnoses Final  diagnoses:  Flank pain  Pulmonary nodule    Rx / DC Orders  ED Discharge Orders         Ordered    cyclobenzaprine (FLEXERIL) 10 MG tablet  2 times daily PRN     Discontinue  Reprint     03/29/20 1201    HYDROcodone-acetaminophen (NORCO/VICODIN) 5-325 MG tablet  Every 6 hours PRN     Discontinue  Reprint     03/29/20 1201           Dorie Rank, MD 03/29/20 1205

## 2020-03-29 NOTE — Discharge Instructions (Signed)
THe xray showed an incidental small pulmonary nodule.  These are often seen.  Discuss with your doctor whether or not to have follow up imaging.  Take the medications as needed for pain and discomfort.

## 2020-03-29 NOTE — ED Triage Notes (Signed)
Patient c/o left flank pain since this AM. Patient denies any issues with urination.

## 2020-04-05 DIAGNOSIS — M25551 Pain in right hip: Secondary | ICD-10-CM | POA: Diagnosis not present

## 2020-04-17 DIAGNOSIS — M5416 Radiculopathy, lumbar region: Secondary | ICD-10-CM | POA: Insufficient documentation

## 2020-04-18 DIAGNOSIS — M5416 Radiculopathy, lumbar region: Secondary | ICD-10-CM | POA: Diagnosis not present

## 2020-05-11 ENCOUNTER — Telehealth: Payer: Self-pay | Admitting: Registered Nurse

## 2020-05-11 ENCOUNTER — Encounter: Payer: Self-pay | Admitting: Registered Nurse

## 2020-05-11 ENCOUNTER — Ambulatory Visit (INDEPENDENT_AMBULATORY_CARE_PROVIDER_SITE_OTHER): Payer: Medicare Other | Admitting: Registered Nurse

## 2020-05-11 ENCOUNTER — Other Ambulatory Visit: Payer: Self-pay

## 2020-05-11 VITALS — BP 136/82 | HR 60 | Temp 98.0°F | Resp 18 | Ht 70.0 in | Wt 221.0 lb

## 2020-05-11 DIAGNOSIS — I129 Hypertensive chronic kidney disease with stage 1 through stage 4 chronic kidney disease, or unspecified chronic kidney disease: Secondary | ICD-10-CM | POA: Diagnosis not present

## 2020-05-11 DIAGNOSIS — E1122 Type 2 diabetes mellitus with diabetic chronic kidney disease: Secondary | ICD-10-CM

## 2020-05-11 DIAGNOSIS — N1831 Chronic kidney disease, stage 3a: Secondary | ICD-10-CM

## 2020-05-11 DIAGNOSIS — E1121 Type 2 diabetes mellitus with diabetic nephropathy: Secondary | ICD-10-CM | POA: Diagnosis not present

## 2020-05-11 DIAGNOSIS — N183 Chronic kidney disease, stage 3 unspecified: Secondary | ICD-10-CM | POA: Diagnosis not present

## 2020-05-11 DIAGNOSIS — M109 Gout, unspecified: Secondary | ICD-10-CM | POA: Diagnosis not present

## 2020-05-11 LAB — POCT GLYCOSYLATED HEMOGLOBIN (HGB A1C): Hemoglobin A1C: 7.7 % — AB (ref 4.0–5.6)

## 2020-05-11 MED ORDER — AMLODIPINE BESY-BENAZEPRIL HCL 5-20 MG PO CAPS: 1.0000 | ORAL_CAPSULE | Freq: Every day | ORAL | 1 refills | Status: DC

## 2020-05-11 MED ORDER — FENOFIBRATE 145 MG PO TABS: ORAL_TABLET | ORAL | 1 refills | Status: DC

## 2020-05-11 MED ORDER — ATORVASTATIN CALCIUM 20 MG PO TABS: 20.0000 mg | ORAL_TABLET | Freq: Every day | ORAL | 1 refills | Status: DC

## 2020-05-11 MED ORDER — METFORMIN HCL 1000 MG PO TABS: ORAL_TABLET | ORAL | 1 refills | Status: DC

## 2020-05-11 MED ORDER — GLIMEPIRIDE 2 MG PO TABS: 2.0000 mg | ORAL_TABLET | Freq: Two times a day (BID) | ORAL | 1 refills | Status: DC

## 2020-05-11 NOTE — Telephone Encounter (Signed)
Pt needs to sch AWV in 6 months. Please advise.

## 2020-05-11 NOTE — Progress Notes (Signed)
Established Patient Office Visit  Subjective:  Patient ID: Samuel Sanders, male    DOB: 21-Oct-1955  Age: 64 y.o. MRN: [id number]  CC:  Chief Complaint  Patient presents with  . Follow-up    6 month follow up on diabetes and patient would also like to discuss medications     HPI CLYDE ZARRELLA presents for 6 mo t2dm follow up  Reports feeling well. Has switched to medicare - who won't cover the invokana. As such, has stopped taking this and doubled up on glimeperide instead, taking one in the mornings and one in the evenings. Seen by ophthalmology recently - pressures in eyes steady, has stopped travoprost.  Otherwise no complaints. Admits to poor diet but plenty of regular exercise.   Past Medical History:  Diagnosis Date  . Essential hypertension   . Hx of adenomatous colonic polyps 01/2020  . Hyperlipidemia   . Type 2 diabetes mellitus ([place])     Past Surgical History:  Procedure Laterality Date  . COLONOSCOPY  2009   normal  . KNEE ARTHROSCOPY Left 1983  . TENDON REPAIR Right 1990   wrist  . TONSILLECTOMY  1958    Family History  Problem Relation Age of Onset  . Cancer Mother   . Healthy Father   . Brain cancer Sister   . Colon cancer Neg Hx   . Colon polyps Neg Hx   . Esophageal cancer Neg Hx   . Rectal cancer Neg Hx   . Stomach cancer Neg Hx     Social History   Socioeconomic History  . Marital status: Married    Spouse name: Not on file  . Number of children: Not on file  . Years of education: Not on file  . Highest education level: Not on file  Occupational History  . Not on file  Tobacco Use  . Smoking status: Former Smoker    Quit date: 06/28/2004    Years since quitting: 15.8  . Smokeless tobacco: Never Used  Vaping Use  . Vaping Use: Never used  Substance and Sexual Activity  . Alcohol use: Yes    Comment: rare beer monthly or so  . Drug use: Not Currently  . Sexual activity: Yes  Other Topics Concern  . Not on file  Social History  Narrative  . Not on file   Social Determinants of Health   Financial Resource Strain:   . Difficulty of Paying Living Expenses: Not on file  Food Insecurity:   . Worried About Charity fundraiser in the Last Year: Not on file  . Ran Out of Food in the Last Year: Not on file  Transportation Needs:   . Lack of Transportation (Medical): Not on file  . Lack of Transportation (Non-Medical): Not on file  Physical Activity:   . Days of Exercise per Week: Not on file  . Minutes of Exercise per Session: Not on file  Stress:   . Feeling of Stress : Not on file  Social Connections:   . Frequency of Communication with Friends and Family: Not on file  . Frequency of Social Gatherings with Friends and Family: Not on file  . Attends Religious Services: Not on file  . Active Member of Clubs or Organizations: Not on file  . Attends Archivist Meetings: Not on file  . Marital Status: Not on file  Intimate Partner Violence:   . Fear of Current or Ex-Partner: Not on file  . Emotionally Abused: Not  on file  . Physically Abused: Not on file  . Sexually Abused: Not on file    Outpatient Medications Prior to Visit  Medication Sig Dispense Refill  . allopurinol (ZYLOPRIM) 100 MG tablet Take 1 tablet (100 mg total) by mouth daily. allopurinol 100 mg tablet 60 tablet 1  . aspirin EC 81 MG tablet Take 81 mg by mouth daily.    Marland Kitchen atorvastatin (LIPITOR) 20 MG tablet Take 1 tablet (20 mg total) by mouth daily. 90 tablet 1  . cyclobenzaprine (FLEXERIL) 10 MG tablet Take 1 tablet (10 mg total) by mouth 2 (two) times daily as needed for muscle spasms. 20 tablet 0  . fenofibrate (TRICOR) 145 MG tablet TAKE 1 TABLET ONCE A DAY ORALLY 90 tablet 1  . glimepiride (AMARYL) 2 MG tablet glimepiride 2 mg tablet 90 tablet 1  . HYDROcodone-acetaminophen (NORCO/VICODIN) 5-325 MG tablet Take 1 tablet by mouth every 6 (six) hours as needed. 12 tablet 0  . metFORMIN (GLUCOPHAGE) 1000 MG tablet TAKE 1/2 TABLET BY  MOUTH EVERY MORNING AND 1 & 1/2 TABLETS IN THE EVENING 180 tablet 1  . Omega-3 1000 MG CAPS Take by mouth.    . testosterone cypionate (DEPOTESTOSTERONE CYPIONATE) 200 MG/ML injection Inject 200 mg into the muscle once a week.    . doxycycline (VIBRA-TABS) 100 MG tablet Take 1 tablet (100 mg total) by mouth 2 (two) times daily. 14 tablet 0  . INVOKANA 100 MG TABS tablet Take 1 tablet (100 mg total) by mouth daily. 90 tablet 1  . Travoprost, BAK Free, (TRAVATAN Z) 0.004 % SOLN ophthalmic solution PUT 1 DROP INTO BOTH EYES AT BEDTIME AS DIRECTED    . amLODipine-benazepril (LOTREL) 5-20 MG capsule Take 1 capsule by mouth daily. 90 capsule 1   No facility-administered medications prior to visit.    No Known Allergies  ROS Review of Systems  Constitutional: Negative.   HENT: Negative.   Eyes: Negative.   Respiratory: Negative.   Cardiovascular: Negative.   Gastrointestinal: Negative.   Endocrine: Negative.   Genitourinary: Negative.   Musculoskeletal: Negative.   Skin: Negative.   Allergic/Immunologic: Negative.   Neurological: Negative.   Psychiatric/Behavioral: Negative.       Objective:    Physical Exam Constitutional:      General: He is not in acute distress.    Appearance: Normal appearance. He is normal weight. He is not ill-appearing, toxic-appearing or diaphoretic.  Cardiovascular:     Rate and Rhythm: Normal rate and regular rhythm.     Heart sounds: Normal heart sounds. No murmur heard.  No friction rub. No gallop.   Pulmonary:     Effort: Pulmonary effort is normal. No respiratory distress.     Breath sounds: Normal breath sounds. No stridor. No wheezing, rhonchi or rales.  Chest:     Chest wall: No tenderness.  Neurological:     General: No focal deficit present.     Mental Status: He is alert and oriented to person, place, and time. Mental status is at baseline.  Psychiatric:        Mood and Affect: Mood normal.        Behavior: Behavior normal.         Thought Content: Thought content normal.        Judgment: Judgment normal.     BP 136/82   Pulse 60   Temp 98 F (36.7 C) (Temporal)   Resp 18   Ht 5\' 10"  (1.778 m)   Wt 221  lb (100.2 kg)   SpO2 97%   BMI 31.71 kg/m  Wt Readings from Last 3 Encounters:  05/11/20 221 lb (100.2 kg)  03/29/20 (!) 215 lb (97.5 kg)  02/03/20 223 lb (101.2 kg)     Health Maintenance Due  Topic Date Due  . URINE MICROALBUMIN  Never done    There are no preventive care reminders to display for this patient.  Lab Results  Component Value Date   TSH 1.200 11/11/2019   Lab Results  Component Value Date   WBC 8.0 03/29/2020   HGB 16.8 03/29/2020   HCT 50.0 03/29/2020   MCV 87.7 03/29/2020   PLT 184 03/29/2020   Lab Results  Component Value Date   NA 140 03/29/2020   K 4.5 03/29/2020   CO2 25 03/29/2020   GLUCOSE 89 03/29/2020   BUN 23 03/29/2020   CREATININE 1.37 (H) 03/29/2020   BILITOT 0.6 03/29/2020   ALKPHOS 42 03/29/2020   AST 23 03/29/2020   ALT 32 03/29/2020   PROT 7.0 03/29/2020   ALBUMIN 4.2 03/29/2020   CALCIUM 9.9 03/29/2020   ANIONGAP 10 03/29/2020   Lab Results  Component Value Date   CHOL 149 11/11/2019   Lab Results  Component Value Date   HDL 37 (L) 11/11/2019   Lab Results  Component Value Date   LDLCALC 77 11/11/2019   Lab Results  Component Value Date   TRIG 211 (H) 11/11/2019   Lab Results  Component Value Date   CHOLHDL 4.0 11/11/2019   Lab Results  Component Value Date   HGBA1C 8.2 (H) 11/11/2019      Assessment & Plan:   Problem List Items Addressed This Visit      Endocrine   Type 2 diabetes mellitus with stage 3 chronic kidney disease, without long-term current use of insulin ([place]) - Primary   Relevant Orders   POCT glycosylated hemoglobin (Hb A1C)    Other Visit Diagnoses    Gout, unspecified cause, unspecified chronicity, unspecified site          No orders of the defined types were placed in this  encounter.   Follow-up: No follow-ups on file.   PLAN  a1c improved to 7.7. Keep up the good work.  Continue on meds: metformin bid, glimepiride bid, HTN and hld as before.  Return in 6 mo for welcome to medicare visit  Patient encouraged to call clinic with any questions, comments, or concerns.  Maximiano Coss, NP

## 2020-05-11 NOTE — Patient Instructions (Signed)
° ° ° °  If you have lab work done today you will be contacted with your lab results within the next 2 weeks.  If you have not heard from us then please contact us. The fastest way to get your results is to register for My Chart. ° ° °IF you received an x-ray today, you will receive an invoice from [facility]. Please contact [facility] at [phone] with questions or concerns regarding your invoice.  ° °IF you received labwork today, you will receive an invoice from [facility]. Please contact [facility] at [phone] with questions or concerns regarding your invoice.  ° °Our billing staff will not be able to assist you with questions regarding bills from these companies. ° °You will be contacted with the lab results as soon as they are available. The fastest way to get your results is to activate your My Chart account. Instructions are located on the last page of this paperwork. If you have not heard from us regarding the results in 2 weeks, please contact this office. °  ° ° ° °

## 2020-05-14 DIAGNOSIS — N1832 Chronic kidney disease, stage 3b: Secondary | ICD-10-CM | POA: Diagnosis not present

## 2020-05-16 DIAGNOSIS — N1831 Chronic kidney disease, stage 3a: Secondary | ICD-10-CM | POA: Diagnosis not present

## 2020-05-16 DIAGNOSIS — I129 Hypertensive chronic kidney disease with stage 1 through stage 4 chronic kidney disease, or unspecified chronic kidney disease: Secondary | ICD-10-CM | POA: Diagnosis not present

## 2020-05-16 DIAGNOSIS — N183 Chronic kidney disease, stage 3 unspecified: Secondary | ICD-10-CM | POA: Diagnosis not present

## 2020-05-16 DIAGNOSIS — E1122 Type 2 diabetes mellitus with diabetic chronic kidney disease: Secondary | ICD-10-CM | POA: Diagnosis not present

## 2020-06-06 ENCOUNTER — Encounter: Payer: Self-pay | Admitting: Registered Nurse

## 2020-09-19 ENCOUNTER — Ambulatory Visit: Payer: Medicare Other | Admitting: Registered Nurse

## 2020-09-26 DIAGNOSIS — H40033 Anatomical narrow angle, bilateral: Secondary | ICD-10-CM | POA: Diagnosis not present

## 2020-09-28 ENCOUNTER — Ambulatory Visit: Payer: Medicare Other | Admitting: Registered Nurse

## 2020-11-05 ENCOUNTER — Encounter: Payer: Self-pay | Admitting: Registered Nurse

## 2020-11-05 NOTE — Telephone Encounter (Signed)
Attempted to call the patient to let him know that he can get the blood work done at the appointment.

## 2020-11-09 DIAGNOSIS — N1831 Chronic kidney disease, stage 3a: Secondary | ICD-10-CM | POA: Diagnosis not present

## 2020-11-14 DIAGNOSIS — N183 Chronic kidney disease, stage 3 unspecified: Secondary | ICD-10-CM | POA: Diagnosis not present

## 2020-11-14 DIAGNOSIS — E1122 Type 2 diabetes mellitus with diabetic chronic kidney disease: Secondary | ICD-10-CM | POA: Diagnosis not present

## 2020-11-14 DIAGNOSIS — N1832 Chronic kidney disease, stage 3b: Secondary | ICD-10-CM | POA: Diagnosis not present

## 2020-11-14 DIAGNOSIS — E669 Obesity, unspecified: Secondary | ICD-10-CM | POA: Insufficient documentation

## 2020-11-14 DIAGNOSIS — I129 Hypertensive chronic kidney disease with stage 1 through stage 4 chronic kidney disease, or unspecified chronic kidney disease: Secondary | ICD-10-CM | POA: Diagnosis not present

## 2020-11-16 ENCOUNTER — Other Ambulatory Visit: Payer: Self-pay

## 2020-11-16 ENCOUNTER — Encounter: Payer: Self-pay | Admitting: Registered Nurse

## 2020-11-16 ENCOUNTER — Ambulatory Visit (INDEPENDENT_AMBULATORY_CARE_PROVIDER_SITE_OTHER): Payer: Medicare Other | Admitting: Registered Nurse

## 2020-11-16 VITALS — BP 142/82 | HR 68 | Temp 97.8°F | Resp 16 | Ht 70.0 in | Wt 228.2 lb

## 2020-11-16 DIAGNOSIS — N1831 Chronic kidney disease, stage 3a: Secondary | ICD-10-CM

## 2020-11-16 DIAGNOSIS — Z0001 Encounter for general adult medical examination with abnormal findings: Secondary | ICD-10-CM

## 2020-11-16 DIAGNOSIS — N183 Chronic kidney disease, stage 3 unspecified: Secondary | ICD-10-CM

## 2020-11-16 DIAGNOSIS — I129 Hypertensive chronic kidney disease with stage 1 through stage 4 chronic kidney disease, or unspecified chronic kidney disease: Secondary | ICD-10-CM | POA: Diagnosis not present

## 2020-11-16 DIAGNOSIS — M109 Gout, unspecified: Secondary | ICD-10-CM | POA: Diagnosis not present

## 2020-11-16 DIAGNOSIS — Z0000 Encounter for general adult medical examination without abnormal findings: Secondary | ICD-10-CM

## 2020-11-16 DIAGNOSIS — E1122 Type 2 diabetes mellitus with diabetic chronic kidney disease: Secondary | ICD-10-CM

## 2020-11-16 MED ORDER — GLIMEPIRIDE 2 MG PO TABS: 2.0000 mg | ORAL_TABLET | Freq: Two times a day (BID) | ORAL | 1 refills | Status: DC

## 2020-11-16 MED ORDER — ATORVASTATIN CALCIUM 20 MG PO TABS: 20.0000 mg | ORAL_TABLET | Freq: Every day | ORAL | 1 refills | Status: DC

## 2020-11-16 MED ORDER — FENOFIBRATE 145 MG PO TABS: ORAL_TABLET | ORAL | 1 refills | Status: DC

## 2020-11-16 MED ORDER — METFORMIN HCL 1000 MG PO TABS: ORAL_TABLET | ORAL | 1 refills | Status: DC

## 2020-11-16 MED ORDER — AMLODIPINE BESY-BENAZEPRIL HCL 5-20 MG PO CAPS: 1.0000 | ORAL_CAPSULE | Freq: Every day | ORAL | 1 refills | Status: DC

## 2020-11-16 MED ORDER — ALLOPURINOL 100 MG PO TABS: 100.0000 mg | ORAL_TABLET | Freq: Every day | ORAL | 1 refills | Status: DC

## 2020-11-16 NOTE — Patient Instructions (Signed)
° ° ° °  If you have lab work done today you will be contacted with your lab results within the next 2 weeks.  If you have not heard from us then please contact us. The fastest way to get your results is to register for My Chart. ° ° °IF you received an x-ray today, you will receive an invoice from [facility]. Please contact [facility] at [phone] with questions or concerns regarding your invoice.  ° °IF you received labwork today, you will receive an invoice from [facility]. Please contact [facility] at [phone] with questions or concerns regarding your invoice.  ° °Our billing staff will not be able to assist you with questions regarding bills from these companies. ° °You will be contacted with the lab results as soon as they are available. The fastest way to get your results is to activate your My Chart account. Instructions are located on the last page of this paperwork. If you have not heard from us regarding the results in 2 weeks, please contact this office. °  ° ° ° °

## 2020-11-16 NOTE — Progress Notes (Signed)
Established Patient Office Visit  Subjective:  Patient ID: Samuel Sanders, male    DOB: 11-Mar-1956  Age: 65 y.o. MRN: [id number]  CC:  Chief Complaint  Patient presents with  . Employment Physical    Pt here for physical no concerns does need some refills of regular meds    HPI KAMARE CASPERS presents for CPE  No acute concerns  Histories reviewed and updated with patient  Hx of htn -continues to take medication with good compliance, no AEs. Hx of t2dm - continues to improve lifestyle and take medication, no AEs Stage 3 CKD, no acute changes   Past Medical History:  Diagnosis Date  . Essential hypertension   . Hx of adenomatous colonic polyps 01/2020  . Hyperlipidemia   . Type 2 diabetes mellitus ([place])     Past Surgical History:  Procedure Laterality Date  . COLONOSCOPY  2009   normal  . KNEE ARTHROSCOPY Left 1983  . TENDON REPAIR Right 1990   wrist  . TONSILLECTOMY  1958    Family History  Problem Relation Age of Onset  . Cancer Mother   . Healthy Father   . Brain cancer Sister   . Colon cancer Neg Hx   . Colon polyps Neg Hx   . Esophageal cancer Neg Hx   . Rectal cancer Neg Hx   . Stomach cancer Neg Hx     Social History   Socioeconomic History  . Marital status: Married    Spouse name: Not on file  . Number of children: Not on file  . Years of education: Not on file  . Highest education level: Not on file  Occupational History  . Not on file  Tobacco Use  . Smoking status: Former Smoker    Quit date: 06/28/2004    Years since quitting: 16.3  . Smokeless tobacco: Never Used  Vaping Use  . Vaping Use: Never used  Substance and Sexual Activity  . Alcohol use: Yes    Comment: rare beer monthly or so  . Drug use: Not Currently  . Sexual activity: Yes  Other Topics Concern  . Not on file  Social History Narrative  . Not on file   Social Determinants of Health   Financial Resource Strain: Not on file  Food Insecurity: Not on file   Transportation Needs: Not on file  Physical Activity: Not on file  Stress: Not on file  Social Connections: Not on file  Intimate Partner Violence: Not on file    Outpatient Medications Prior to Visit  Medication Sig Dispense Refill  . aspirin EC 81 MG tablet Take 81 mg by mouth daily.    . Omega-3 1000 MG CAPS Take by mouth.    . testosterone cypionate (DEPOTESTOSTERONE CYPIONATE) 200 MG/ML injection Inject 200 mg into the muscle once a week.    Marland Kitchen allopurinol (ZYLOPRIM) 100 MG tablet Take 1 tablet (100 mg total) by mouth daily. allopurinol 100 mg tablet 60 tablet 1  . atorvastatin (LIPITOR) 20 MG tablet Take 1 tablet (20 mg total) by mouth daily. 90 tablet 1  . fenofibrate (TRICOR) 145 MG tablet TAKE 1 TABLET ONCE A DAY ORALLY 90 tablet 1  . glimepiride (AMARYL) 2 MG tablet Take 1 tablet (2 mg total) by mouth 2 (two) times daily before a meal. glimepiride 2 mg tablet 180 tablet 1  . metFORMIN (GLUCOPHAGE) 1000 MG tablet TAKE 1/2 TABLET BY MOUTH EVERY MORNING AND 1 & 1/2 TABLETS IN THE EVENING 180  tablet 1  . cyclobenzaprine (FLEXERIL) 10 MG tablet Take 1 tablet (10 mg total) by mouth 2 (two) times daily as needed for muscle spasms. (Patient not taking: Reported on 11/16/2020) 20 tablet 0  . HYDROcodone-acetaminophen (NORCO/VICODIN) 5-325 MG tablet Take 1 tablet by mouth every 6 (six) hours as needed. (Patient not taking: Reported on 11/16/2020) 12 tablet 0  . amLODipine-benazepril (LOTREL) 5-20 MG capsule Take 1 capsule by mouth daily. 90 capsule 1   No facility-administered medications prior to visit.    No Known Allergies  ROS Review of Systems  Constitutional: Negative.   HENT: Negative.   Eyes: Negative.   Respiratory: Negative.   Cardiovascular: Negative.   Gastrointestinal: Negative.   Genitourinary: Negative.   Musculoskeletal: Negative.   Skin: Negative.   Neurological: Negative.   Psychiatric/Behavioral: Negative.   All other systems reviewed and are negative.      Objective:    Physical Exam Vitals and nursing note reviewed.  Constitutional:      General: He is not in acute distress.    Appearance: Normal appearance. He is normal weight. He is not ill-appearing, toxic-appearing or diaphoretic.  HENT:     Head: Normocephalic and atraumatic.     Right Ear: Tympanic membrane, ear canal and external ear normal. There is no impacted cerumen.     Left Ear: Tympanic membrane, ear canal and external ear normal. There is no impacted cerumen.     Nose: Nose normal. No congestion or rhinorrhea.     Mouth/Throat:     Mouth: Mucous membranes are moist.     Pharynx: Oropharynx is clear. No oropharyngeal exudate or posterior oropharyngeal erythema.  Eyes:     General: No scleral icterus.       Right eye: No discharge.        Left eye: No discharge.     Extraocular Movements: Extraocular movements intact.     Conjunctiva/sclera: Conjunctivae normal.     Pupils: Pupils are equal, round, and reactive to light.  Neck:     Vascular: No carotid bruit.  Cardiovascular:     Rate and Rhythm: Normal rate and regular rhythm.     Pulses: Normal pulses.     Heart sounds: Normal heart sounds. No murmur heard. No friction rub. No gallop.   Pulmonary:     Effort: Pulmonary effort is normal. No respiratory distress.     Breath sounds: Normal breath sounds. No stridor. No wheezing, rhonchi or rales.  Chest:     Chest wall: No tenderness.  Abdominal:     General: Abdomen is flat. Bowel sounds are normal. There is no distension.     Palpations: Abdomen is soft. There is no mass.     Tenderness: There is no abdominal tenderness. There is no right CVA tenderness, left CVA tenderness, guarding or rebound.     Hernia: No hernia is present.  Musculoskeletal:        General: No swelling, tenderness, deformity or signs of injury. Normal range of motion.     Cervical back: Normal range of motion and neck supple. No rigidity or tenderness.     Right lower leg: No edema.      Left lower leg: No edema.  Lymphadenopathy:     Cervical: No cervical adenopathy.  Skin:    General: Skin is warm and dry.     Capillary Refill: Capillary refill takes less than 2 seconds.     Coloration: Skin is not jaundiced or pale.  Findings: No bruising, erythema, lesion or rash.  Neurological:     General: No focal deficit present.     Mental Status: He is alert and oriented to person, place, and time. Mental status is at baseline.     Cranial Nerves: No cranial nerve deficit.     Motor: No weakness.     Gait: Gait normal.  Psychiatric:        Mood and Affect: Mood normal.        Behavior: Behavior normal.        Thought Content: Thought content normal.        Judgment: Judgment normal.     BP (!) 142/82   Pulse 68   Temp 97.8 F (36.6 C) (Temporal)   Resp 16   Ht 5\' 10"  (1.778 m)   Wt 228 lb 3.2 oz (103.5 kg)   SpO2 97%   BMI 32.74 kg/m  Wt Readings from Last 3 Encounters:  11/16/20 228 lb 3.2 oz (103.5 kg)  05/11/20 221 lb (100.2 kg)  03/29/20 (!) 215 lb (97.5 kg)     Health Maintenance Due  Topic Date Due  . FOOT EXAM  Never done  . URINE MICROALBUMIN  Never done  . COVID-19 Vaccine (3 - Booster for Pfizer series) 06/06/2020  . HEMOGLOBIN A1C  11/08/2020    There are no preventive care reminders to display for this patient.  Lab Results  Component Value Date   TSH 1.200 11/11/2019   Lab Results  Component Value Date   WBC 8.0 03/29/2020   HGB 16.8 03/29/2020   HCT 50.0 03/29/2020   MCV 87.7 03/29/2020   PLT 184 03/29/2020   Lab Results  Component Value Date   NA 140 03/29/2020   K 4.5 03/29/2020   CO2 25 03/29/2020   GLUCOSE 89 03/29/2020   BUN 23 03/29/2020   CREATININE 1.37 (H) 03/29/2020   BILITOT 0.6 03/29/2020   ALKPHOS 42 03/29/2020   AST 23 03/29/2020   ALT 32 03/29/2020   PROT 7.0 03/29/2020   ALBUMIN 4.2 03/29/2020   CALCIUM 9.9 03/29/2020   ANIONGAP 10 03/29/2020   Lab Results  Component Value Date   CHOL 149  11/11/2019   Lab Results  Component Value Date   HDL 37 (L) 11/11/2019   Lab Results  Component Value Date   LDLCALC 77 11/11/2019   Lab Results  Component Value Date   TRIG 211 (H) 11/11/2019   Lab Results  Component Value Date   CHOLHDL 4.0 11/11/2019   Lab Results  Component Value Date   HGBA1C 7.7 (A) 05/11/2020      Assessment & Plan:   Problem List Items Addressed This Visit      Cardiovascular and Mediastinum   Benign hypertension with CKD (chronic kidney disease) stage III (HCC)   Relevant Medications   atorvastatin (LIPITOR) 20 MG tablet   fenofibrate (TRICOR) 145 MG tablet   amLODipine-benazepril (LOTREL) 5-20 MG capsule   Other Relevant Orders   CBC     Endocrine   Type 2 diabetes mellitus with stage 3 chronic kidney disease, without long-term current use of insulin (HCC)   Relevant Medications   atorvastatin (LIPITOR) 20 MG tablet   fenofibrate (TRICOR) 145 MG tablet   glimepiride (AMARYL) 2 MG tablet   metFORMIN (GLUCOPHAGE) 1000 MG tablet   amLODipine-benazepril (LOTREL) 5-20 MG capsule   Other Relevant Orders   Microalbumin / creatinine urine ratio   Hemoglobin A1c   CBC  Basic Metabolic Panel    Other Visit Diagnoses    Gout, unspecified cause, unspecified chronicity, unspecified site       Relevant Medications   allopurinol (ZYLOPRIM) 100 MG tablet      Meds ordered this encounter  Medications  . allopurinol (ZYLOPRIM) 100 MG tablet    Sig: Take 1 tablet (100 mg total) by mouth daily. allopurinol 100 mg tablet    Dispense:  60 tablet    Refill:  1  . atorvastatin (LIPITOR) 20 MG tablet    Sig: Take 1 tablet (20 mg total) by mouth daily.    Dispense:  90 tablet    Refill:  1  . fenofibrate (TRICOR) 145 MG tablet    Sig: TAKE 1 TABLET ONCE A DAY ORALLY    Dispense:  90 tablet    Refill:  1  . glimepiride (AMARYL) 2 MG tablet    Sig: Take 1 tablet (2 mg total) by mouth 2 (two) times daily before a meal. glimepiride 2 mg tablet     Dispense:  180 tablet    Refill:  1  . metFORMIN (GLUCOPHAGE) 1000 MG tablet    Sig: TAKE 1/2 TABLET BY MOUTH EVERY MORNING AND 1 & 1/2 TABLETS IN THE EVENING    Dispense:  180 tablet    Refill:  1  . amLODipine-benazepril (LOTREL) 5-20 MG capsule    Sig: Take 1 capsule by mouth daily.    Dispense:  90 capsule    Refill:  1    Follow-up: No follow-ups on file.   PLAN  No acute concerns noted  Pt continues to follow with nephrology regularly, eye exams annually, staying active  Return in 6 mo  Lab orders placed  Patient encouraged to call clinic with any questions, comments, or concerns.   Maximiano Coss, NP

## 2020-11-21 DIAGNOSIS — E1122 Type 2 diabetes mellitus with diabetic chronic kidney disease: Secondary | ICD-10-CM | POA: Diagnosis not present

## 2020-11-21 DIAGNOSIS — N1831 Chronic kidney disease, stage 3a: Secondary | ICD-10-CM | POA: Diagnosis not present

## 2020-11-21 DIAGNOSIS — N183 Chronic kidney disease, stage 3 unspecified: Secondary | ICD-10-CM | POA: Diagnosis not present

## 2020-11-21 DIAGNOSIS — I129 Hypertensive chronic kidney disease with stage 1 through stage 4 chronic kidney disease, or unspecified chronic kidney disease: Secondary | ICD-10-CM | POA: Diagnosis not present

## 2020-11-22 LAB — CBC
Hematocrit: 48.9 % (ref 37.5–51.0)
Hemoglobin: 16.5 g/dL (ref 13.0–17.7)
MCH: 28.7 pg (ref 26.6–33.0)
MCHC: 33.7 g/dL (ref 31.5–35.7)
MCV: 85 fL (ref 79–97)
Platelets: 284 10*3/uL (ref 150–450)
RBC: 5.75 x10E6/uL (ref 4.14–5.80)
RDW: 12.9 % (ref 11.6–15.4)
WBC: 8.8 10*3/uL (ref 3.4–10.8)

## 2020-11-22 LAB — BASIC METABOLIC PANEL
BUN/Creatinine Ratio: 15 (ref 10–24)
BUN: 19 mg/dL (ref 8–27)
CO2: 22 mmol/L (ref 20–29)
Calcium: 10.2 mg/dL (ref 8.6–10.2)
Chloride: 103 mmol/L (ref 96–106)
Creatinine, Ser: 1.27 mg/dL (ref 0.76–1.27)
Glucose: 177 mg/dL — ABNORMAL HIGH (ref 65–99)
Potassium: 4.7 mmol/L (ref 3.5–5.2)
Sodium: 143 mmol/L (ref 134–144)
eGFR: 63 mL/min/{1.73_m2} (ref >59)

## 2020-11-22 LAB — HEMOGLOBIN A1C
Est. average glucose Bld gHb Est-mCnc: 217 mg/dL
Hgb A1c MFr Bld: 9.2 % — ABNORMAL HIGH (ref 4.8–5.6)

## 2020-11-22 LAB — MICROALBUMIN / CREATININE URINE RATIO
Creatinine, Urine: 112.7 mg/dL
Microalb/Creat Ratio: 89 mg/g creat — ABNORMAL HIGH (ref 0–29)
Microalbumin, Urine: 100.1 ug/mL

## 2021-01-01 IMAGING — CT CT RENAL STONE PROTOCOL
2 of 4 series · 16 of 46 positions shown, 18 images · non-contrast
Comparison: None.

CLINICAL DATA: LEFT flank pain

EXAM:
CT ABDOMEN AND PELVIS WITHOUT CONTRAST
TECHNIQUE: Multidetector CT imaging of the abdomen and pelvis was performed
following the standard protocol without IV contrast.

[Series 2: axial st · axial · 0.87mm/px · z∈[+1044,+1454]mm · 13 of 94 slices shown, 15 images]
[im 6/94  soft-tissue]
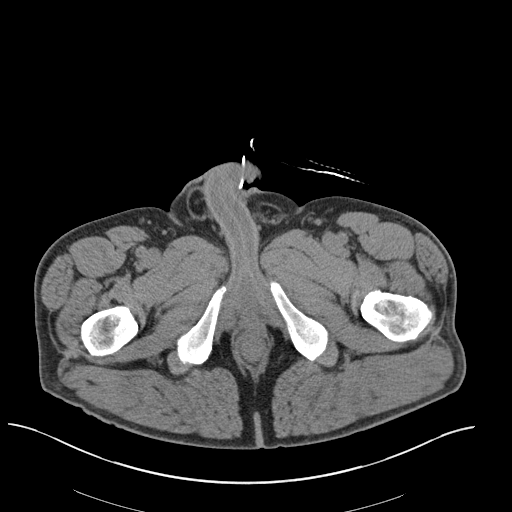
[im 6/94  bone]
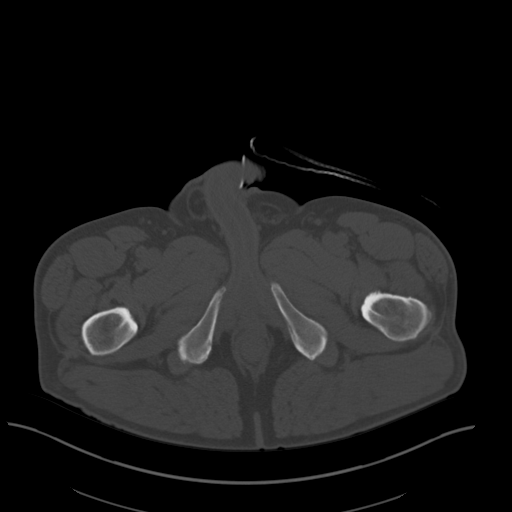
[im 12/94  soft-tissue]
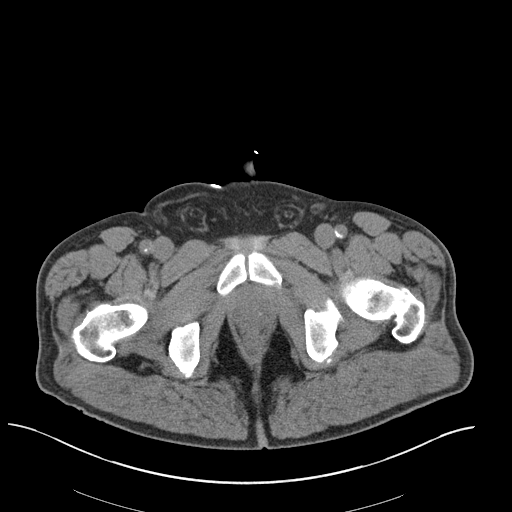
[im 18/94  soft-tissue]
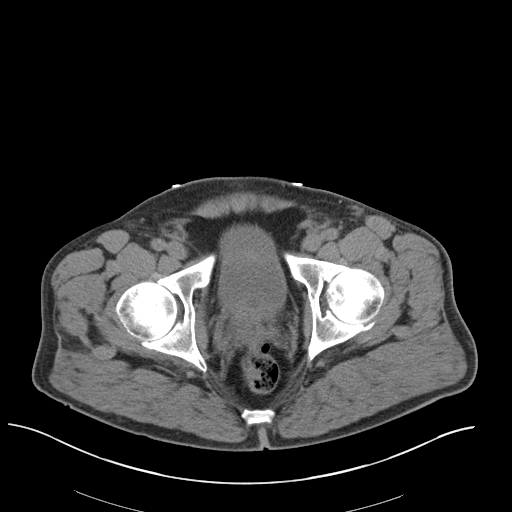
[im 30/94  soft-tissue]
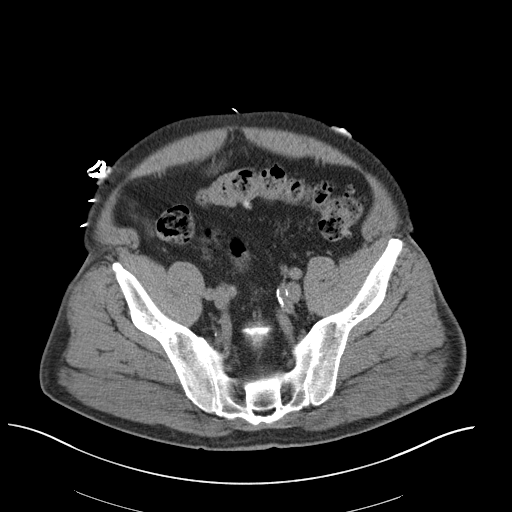
[im 35/94  soft-tissue]
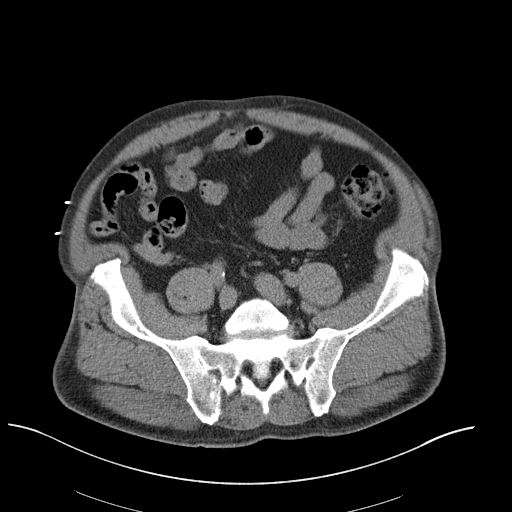
[im 41/94  soft-tissue]
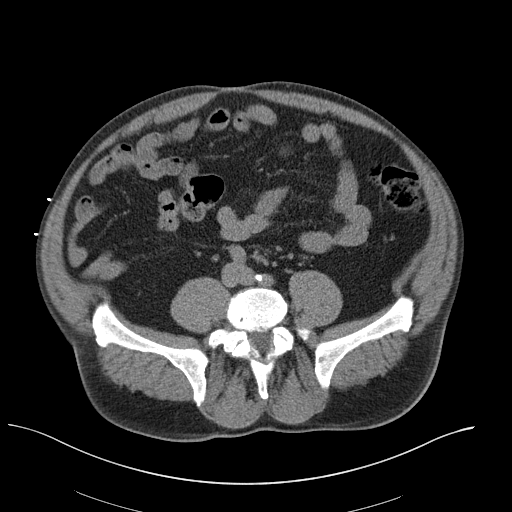
[im 47/94  soft-tissue]
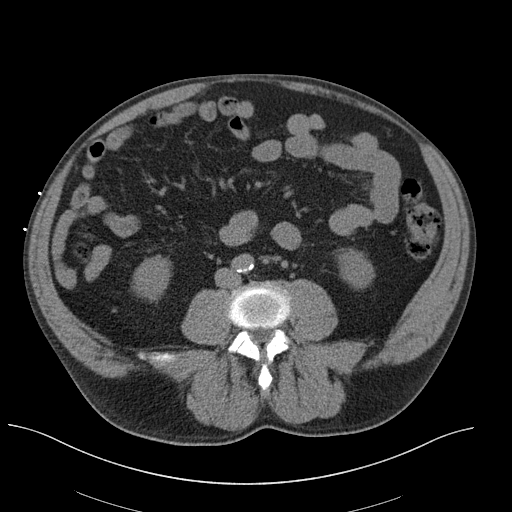
[im 53/94  soft-tissue]
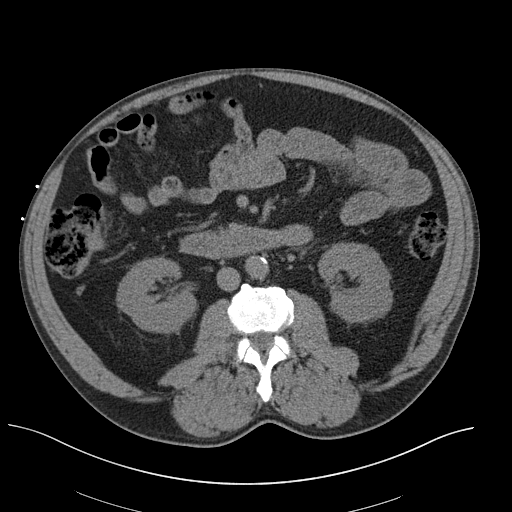
[im 59/94  soft-tissue]
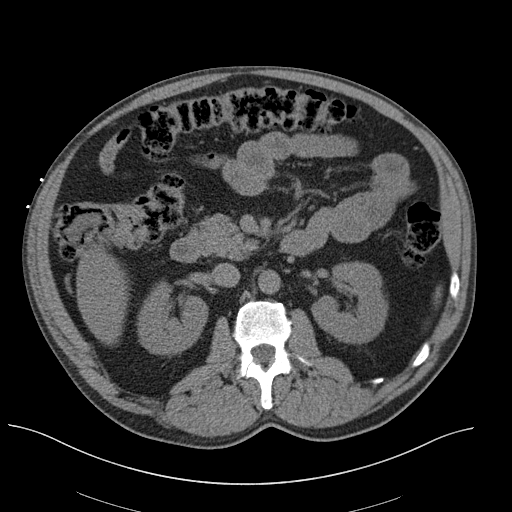
[im 59/94  bone]
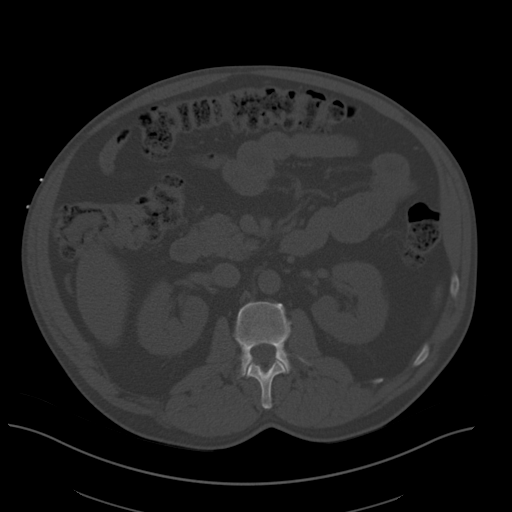
[im 64/94  soft-tissue]
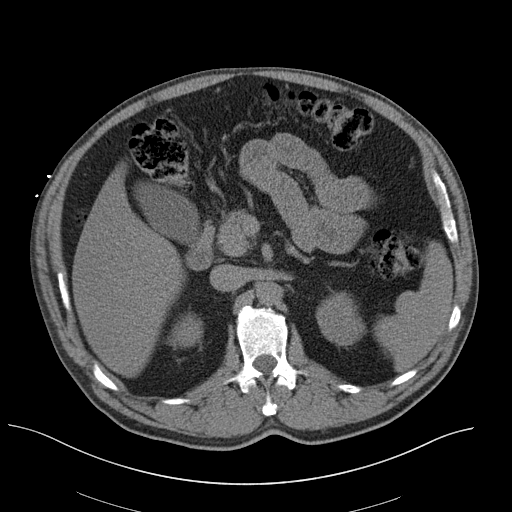
[im 76/94  soft-tissue]
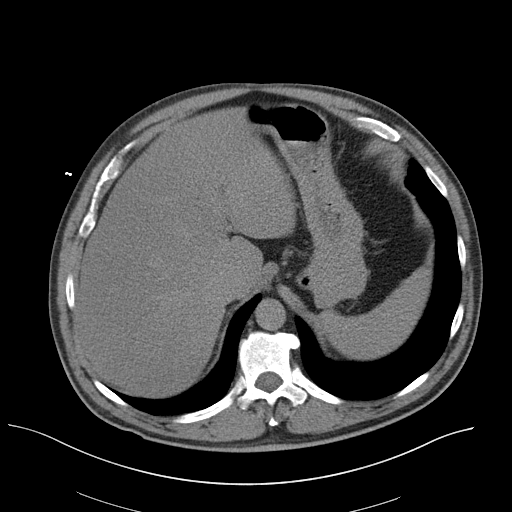
[im 82/94  soft-tissue]
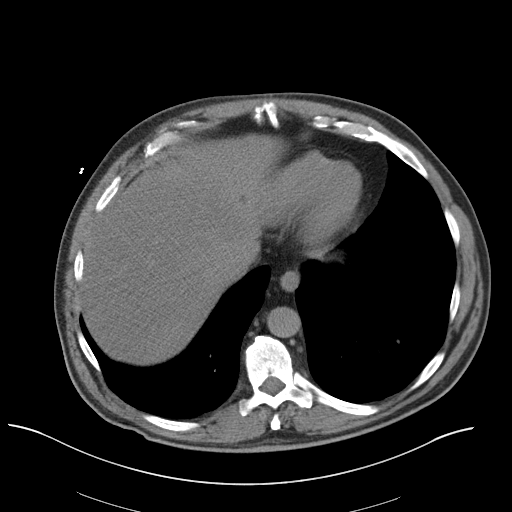
[im 88/94  soft-tissue]
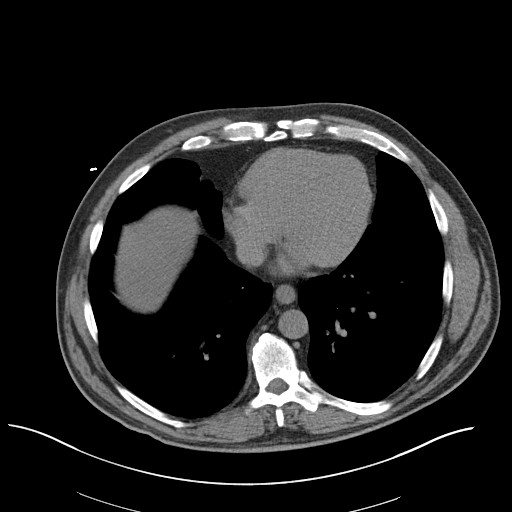

[Series 5: coronal · coronal · 0.82mm/px · 3 of 171 slices shown]
[im 57/171  soft-tissue]
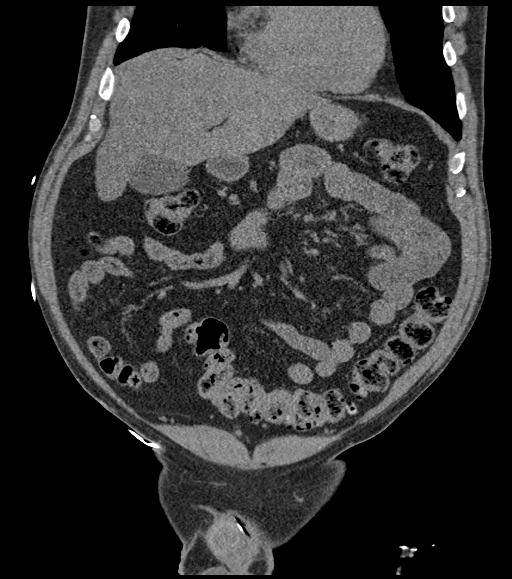
[im 76/171  soft-tissue]
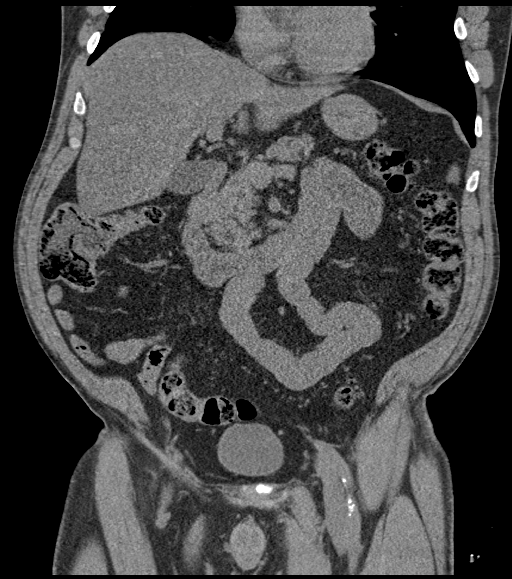
[im 95/171  soft-tissue]
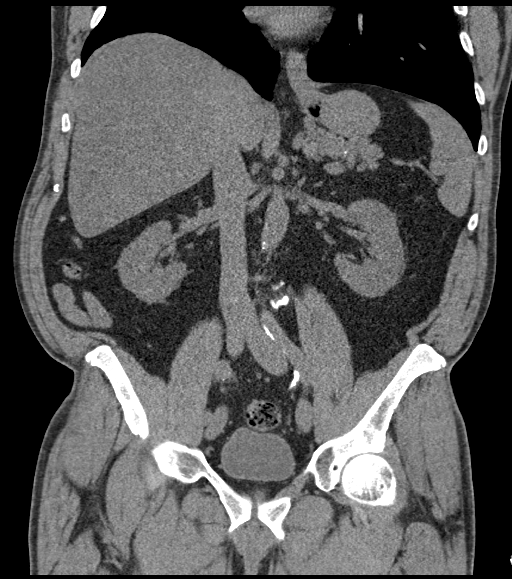

[16 of 46 positions shown; findings below may reference images not displayed]

FINDINGS: Lower chest: LEFT lower lobe pulmonary nodule measures 5 mm (series
4, image 24). Lingular atelectasis.

Hepatobiliary: Hepatic steatosis. Indeterminate hypodense lesion of
the LEFT hepatic dome is too small to accurately characterize.
Cholelithiasis without evidence of cholecystitis.

Pancreas: Unremarkable. No pancreatic ductal dilatation or
surrounding inflammatory changes.

Spleen: Normal in size without focal abnormality.

Adrenals/Urinary Tract: Adrenal glands are unremarkable. No
hydronephrosis. No renal calculi visualized. Subcentimeter hypodense
lesion of the inner LEFT kidney is too small to accurately
characterize. Bladder is unremarkable.

Stomach/Bowel: Diverticulosis without evidence of diverticulitis. No
evidence of bowel obstruction. The appendix is unremarkable.

Vascular/Lymphatic: Moderate atherosclerotic calcifications of the
aorta. No suspicious adenopathy.

Reproductive: Coarse calcifications of the prostate.

Other: No free fluid or free air.

Musculoskeletal: Bilateral pars defects of L5-S1 with grade 1
anterolisthesis. Multilevel degenerative changes of the
thoracolumbar spine.
IMPRESSION: 1. No acute intra-abdominal or intrapelvic pathology. No findings to
explain the patient's left flank pain.
2. Hepatic steatosis.
3. 5 mm LEFT lower lobe pulmonary nodule. No follow-up needed if
patient is low-risk. Non-contrast chest CT can be considered in 12
months if patient is high-risk. This recommendation follows the
consensus statement: Guidelines for Management of Incidental
Pulmonary Nodules Detected on CT Images: From the [HOSPITAL]

Aortic Atherosclerosis (2SBWC-NZ8.8).

## 2021-01-15 ENCOUNTER — Encounter: Payer: Self-pay | Admitting: Registered Nurse

## 2021-03-01 ENCOUNTER — Encounter: Payer: Self-pay | Admitting: Registered Nurse

## 2021-03-12 ENCOUNTER — Other Ambulatory Visit: Payer: Self-pay | Admitting: Registered Nurse

## 2021-03-12 DIAGNOSIS — M109 Gout, unspecified: Secondary | ICD-10-CM

## 2021-05-07 ENCOUNTER — Other Ambulatory Visit: Payer: Self-pay | Admitting: Registered Nurse

## 2021-05-07 DIAGNOSIS — M109 Gout, unspecified: Secondary | ICD-10-CM

## 2021-05-11 ENCOUNTER — Other Ambulatory Visit: Payer: Self-pay | Admitting: Registered Nurse

## 2021-05-11 DIAGNOSIS — E1122 Type 2 diabetes mellitus with diabetic chronic kidney disease: Secondary | ICD-10-CM

## 2021-05-11 DIAGNOSIS — N1831 Chronic kidney disease, stage 3a: Secondary | ICD-10-CM

## 2021-05-13 ENCOUNTER — Other Ambulatory Visit: Payer: Self-pay | Admitting: Registered Nurse

## 2021-05-13 DIAGNOSIS — N1831 Chronic kidney disease, stage 3a: Secondary | ICD-10-CM

## 2021-05-13 DIAGNOSIS — E1122 Type 2 diabetes mellitus with diabetic chronic kidney disease: Secondary | ICD-10-CM

## 2021-05-15 ENCOUNTER — Encounter: Payer: Self-pay | Admitting: Registered Nurse

## 2021-05-15 ENCOUNTER — Ambulatory Visit (INDEPENDENT_AMBULATORY_CARE_PROVIDER_SITE_OTHER): Payer: Medicare Other | Admitting: Registered Nurse

## 2021-05-15 ENCOUNTER — Other Ambulatory Visit: Payer: Self-pay

## 2021-05-15 VITALS — BP 122/80 | HR 60 | Temp 98.2°F | Resp 17 | Wt 226.8 lb

## 2021-05-15 DIAGNOSIS — Z125 Encounter for screening for malignant neoplasm of prostate: Secondary | ICD-10-CM | POA: Diagnosis not present

## 2021-05-15 DIAGNOSIS — N1831 Chronic kidney disease, stage 3a: Secondary | ICD-10-CM

## 2021-05-15 DIAGNOSIS — N183 Chronic kidney disease, stage 3 unspecified: Secondary | ICD-10-CM | POA: Diagnosis not present

## 2021-05-15 DIAGNOSIS — Z1159 Encounter for screening for other viral diseases: Secondary | ICD-10-CM

## 2021-05-15 DIAGNOSIS — E1122 Type 2 diabetes mellitus with diabetic chronic kidney disease: Secondary | ICD-10-CM

## 2021-05-15 DIAGNOSIS — M109 Gout, unspecified: Secondary | ICD-10-CM | POA: Diagnosis not present

## 2021-05-15 DIAGNOSIS — I129 Hypertensive chronic kidney disease with stage 1 through stage 4 chronic kidney disease, or unspecified chronic kidney disease: Secondary | ICD-10-CM | POA: Diagnosis not present

## 2021-05-15 LAB — PSA, MEDICARE: PSA: 1.97 ng/ml (ref 0.10–4.00)

## 2021-05-15 LAB — COMPREHENSIVE METABOLIC PANEL
ALT: 22 U/L (ref 0–53)
AST: 15 U/L (ref 0–37)
Albumin: 4.3 g/dL (ref 3.5–5.2)
Alkaline Phosphatase: 46 U/L (ref 39–117)
BUN: 25 mg/dL — ABNORMAL HIGH (ref 6–23)
CO2: 26 mEq/L (ref 19–32)
Calcium: 9.8 mg/dL (ref 8.4–10.5)
Chloride: 103 mEq/L (ref 96–112)
Creatinine, Ser: 1.39 mg/dL (ref 0.40–1.50)
GFR: 52.97 mL/min — ABNORMAL LOW (ref >60.00)
Glucose, Bld: 160 mg/dL — ABNORMAL HIGH (ref 70–99)
Potassium: 4.7 mEq/L (ref 3.5–5.1)
Sodium: 138 mEq/L (ref 135–145)
Total Bilirubin: 0.6 mg/dL (ref 0.2–1.2)
Total Protein: 6.7 g/dL (ref 6.0–8.3)

## 2021-05-15 LAB — CBC WITH DIFFERENTIAL/PLATELET
Basophils Absolute: 0.1 10*3/uL (ref 0.0–0.1)
Basophils Relative: 0.7 % (ref 0.0–3.0)
Eosinophils Absolute: 0.4 10*3/uL (ref 0.0–0.7)
Eosinophils Relative: 4.8 % (ref 0.0–5.0)
HCT: 45.6 % (ref 39.0–52.0)
Hemoglobin: 15.6 g/dL (ref 13.0–17.0)
Lymphocytes Relative: 23.4 % (ref 12.0–46.0)
Lymphs Abs: 1.9 10*3/uL (ref 0.7–4.0)
MCHC: 34.2 g/dL (ref 30.0–36.0)
MCV: 85 fl (ref 78.0–100.0)
Monocytes Absolute: 0.5 10*3/uL (ref 0.1–1.0)
Monocytes Relative: 6.1 % (ref 3.0–12.0)
Neutro Abs: 5.3 10*3/uL (ref 1.4–7.7)
Neutrophils Relative %: 65 % (ref 43.0–77.0)
Platelets: 231 10*3/uL (ref 150.0–400.0)
RBC: 5.37 Mil/uL (ref 4.22–5.81)
RDW: 13 % (ref 11.5–15.5)
WBC: 8.2 10*3/uL (ref 4.0–10.5)

## 2021-05-15 LAB — LIPID PANEL
Cholesterol: 138 mg/dL (ref 0–200)
HDL: 31.4 mg/dL — ABNORMAL LOW (ref >39.00)
LDL Cholesterol: 70 mg/dL (ref 0–99)
NonHDL: 106.6
Total CHOL/HDL Ratio: 4
Triglycerides: 185 mg/dL — ABNORMAL HIGH (ref 0.0–149.0)
VLDL: 37 mg/dL (ref 0.0–40.0)

## 2021-05-15 LAB — HEMOGLOBIN A1C: Hgb A1c MFr Bld: 8.6 % — ABNORMAL HIGH (ref 4.6–6.5)

## 2021-05-15 MED ORDER — ATORVASTATIN CALCIUM 20 MG PO TABS: 20.0000 mg | ORAL_TABLET | Freq: Every day | ORAL | 1 refills | Status: DC

## 2021-05-15 MED ORDER — AMLODIPINE BESY-BENAZEPRIL HCL 5-20 MG PO CAPS: 1.0000 | ORAL_CAPSULE | Freq: Every day | ORAL | 1 refills | Status: DC

## 2021-05-15 MED ORDER — GLIMEPIRIDE 2 MG PO TABS: 2.0000 mg | ORAL_TABLET | Freq: Two times a day (BID) | ORAL | 1 refills | Status: DC

## 2021-05-15 MED ORDER — METFORMIN HCL 1000 MG PO TABS: ORAL_TABLET | ORAL | 1 refills | Status: DC

## 2021-05-15 MED ORDER — ALLOPURINOL 100 MG PO TABS: 100.0000 mg | ORAL_TABLET | Freq: Every day | ORAL | 1 refills | Status: DC

## 2021-05-15 MED ORDER — FENOFIBRATE 145 MG PO TABS: ORAL_TABLET | ORAL | 1 refills | Status: DC

## 2021-05-15 NOTE — Patient Instructions (Addendum)
Samuel Sanders -   Always a pleasure.  Blood pressure looking great today.   We'll know what A1c is looking like by midday. If still elevated, we'll opt for Januvia (combo janumet and metformin), Farxiga, or adding a once weekly GLP-1 inhibitor like Trulicity or Semaglutide.   Let's plan to check in in 3 mo if sugars too high. If stable where we need them to be, 6 mo  Let me know if you have concerns in the mean time,  Thanks,  Rich

## 2021-05-15 NOTE — Progress Notes (Signed)
Established Patient Office Visit  Subjective:  Patient ID: Samuel Sanders, male    DOB: 1956-09-01  Age: 65 y.o. MRN: [id number]  CC:  Chief Complaint  Patient presents with   Follow-up    Med recheck    HPI Samuel Sanders presents for follow up  t2dm Last A1c:  Lab Results  Component Value Date   HGBA1C 9.2 (H) 11/21/2020    Currently taking: glimepiride 9m po bid ac, metformin 10016mpo bid ac No new complications Reports good compliance with medications Diet has been improved Exercise habits have been better through the summer.  Hypertension: Patient Currently taking: amlodipine-benazepril 5-2077mo qd Good effect. No AEs. Denies CV symptoms including: chest pain, shob, doe, headache, visual changes, fatigue, claudication, and dependent edema.   Previous readings and labs: BP Readings from Last 3 Encounters:  05/15/21 122/80  11/16/20 (!) 142/82  05/11/20 136/82   Lab Results  Component Value Date   CREATININE 1.27 11/21/2020     Hld assoc with t2dm Lab Results  Component Value Date   CHOL 149 11/11/2019   HDL 37 (L) 11/11/2019   LDLCALC 77 11/11/2019   TRIG 211 (H) 11/11/2019   CHOLHDL 4.0 11/11/2019  Taking atorvastatin 52m80m qd. Good effect, no AE.  Hoping to continue   HM Ophthalmology exam - will schedule Foot exam - referral placed Covid booster - he will enter info via MyChart Flu shot - PNA Vaccine -  Zoster Vaccine -  Hep C Screening - will collect today.  Past Medical History:  Diagnosis Date   Essential hypertension    Hx of adenomatous colonic polyps 01/2020   Hyperlipidemia    Type 2 diabetes mellitus (HCC)[place]  Past Surgical History:  Procedure Laterality Date   COLONOSCOPY  2009   normal   KNEE ARTHROSCOPY Left 1983   TENDON REPAIR Right 1990   wrist   TONSILLECTOMY  1958    Family History  Problem Relation Age of Onset   Cancer Mother    Healthy Father    Brain cancer Sister    Colon cancer Neg Hx    Colon  polyps Neg Hx    Esophageal cancer Neg Hx    Rectal cancer Neg Hx    Stomach cancer Neg Hx     Social History   Socioeconomic History   Marital status: Married    Spouse name: Not on file   Number of children: Not on file   Years of education: Not on file   Highest education level: Not on file  Occupational History   Not on file  Tobacco Use   Smoking status: Former    Types: Cigarettes    Quit date: 06/28/2004    Years since quitting: 16.8   Smokeless tobacco: Never  Vaping Use   Vaping Use: Never used  Substance and Sexual Activity   Alcohol use: Yes    Comment: rare beer monthly or so   Drug use: Not Currently   Sexual activity: Yes  Other Topics Concern   Not on file  Social History Narrative   Not on file   Social Determinants of Health   Financial Resource Strain: Not on file  Food Insecurity: Not on file  Transportation Needs: Not on file  Physical Activity: Not on file  Stress: Not on file  Social Connections: Not on file  Intimate Partner Violence: Not on file    Outpatient Medications Prior to Visit  Medication Sig  Dispense Refill   aspirin EC 81 MG tablet Take 81 mg by mouth daily.     Omega-3 1000 MG CAPS Take by mouth.     testosterone cypionate (DEPOTESTOSTERONE CYPIONATE) 200 MG/ML injection Inject 200 mg into the muscle once a week.     allopurinol (ZYLOPRIM) 100 MG tablet TAKE ONE TABLET BY MOUTH ONE TIME DAILY 60 tablet 0   amLODipine-benazepril (LOTREL) 5-20 MG capsule Take 1 capsule by mouth daily. 90 capsule 1   atorvastatin (LIPITOR) 20 MG tablet TAKE ONE TABLET BY MOUTH ONE TIME DAILY 90 tablet 0   fenofibrate (TRICOR) 145 MG tablet TAKE 1 TABLET ONCE A DAY ORALLY 90 tablet 1   glimepiride (AMARYL) 2 MG tablet Take 1 tablet (2 mg total) by mouth 2 (two) times daily before a meal. glimepiride 2 mg tablet 180 tablet 1   metFORMIN (GLUCOPHAGE) 1000 MG tablet TAKE 1/2 TABLET BY MOUTH EVERY MORNING AND 1 & 1/2 TABLETS IN THE EVENING 180 tablet  1   cyclobenzaprine (FLEXERIL) 10 MG tablet Take 1 tablet (10 mg total) by mouth 2 (two) times daily as needed for muscle spasms. (Patient not taking: No sig reported) 20 tablet 0   HYDROcodone-acetaminophen (NORCO/VICODIN) 5-325 MG tablet Take 1 tablet by mouth every 6 (six) hours as needed. (Patient not taking: No sig reported) 12 tablet 0   No facility-administered medications prior to visit.    No Known Allergies  ROS Review of Systems  Constitutional: Negative.   HENT: Negative.    Eyes: Negative.   Respiratory: Negative.    Cardiovascular: Negative.   Gastrointestinal: Negative.   Genitourinary: Negative.   Musculoskeletal: Negative.   Skin: Negative.   Neurological: Negative.   Psychiatric/Behavioral: Negative.    All other systems reviewed and are negative.    Objective:    Physical Exam Constitutional:      General: He is not in acute distress.    Appearance: Normal appearance. He is normal weight. He is not ill-appearing, toxic-appearing or diaphoretic.  Cardiovascular:     Rate and Rhythm: Normal rate and regular rhythm.     Heart sounds: Normal heart sounds. No murmur heard.   No friction rub. No gallop.  Pulmonary:     Effort: Pulmonary effort is normal. No respiratory distress.     Breath sounds: Normal breath sounds. No stridor. No wheezing, rhonchi or rales.  Chest:     Chest wall: No tenderness.  Neurological:     General: No focal deficit present.     Mental Status: He is alert and oriented to person, place, and time. Mental status is at baseline.  Psychiatric:        Mood and Affect: Mood normal.        Behavior: Behavior normal.        Thought Content: Thought content normal.        Judgment: Judgment normal.    BP 122/80   Pulse 60   Temp 98.2 F (36.8 C) (Temporal)   Resp 17   Wt 226 lb 12.8 oz (102.9 kg)   SpO2 98%   BMI 32.54 kg/m  Wt Readings from Last 3 Encounters:  05/15/21 226 lb 12.8 oz (102.9 kg)  11/16/20 228 lb 3.2 oz (103.5  kg)  05/11/20 221 lb (100.2 kg)     Health Maintenance Due  Topic Date Due   FOOT EXAM  Never done   Hepatitis C Screening  Never done   Zoster Vaccines- Shingrix (1 of 2) Never  done   PNA vac Low Risk Adult (1 of 2 - PCV13) Never done   COVID-19 Vaccine (3 - Booster for Pfizer series) 05/07/2020   OPHTHALMOLOGY EXAM  03/30/2021   INFLUENZA VACCINE  04/01/2021    There are no preventive care reminders to display for this patient.  Lab Results  Component Value Date   TSH 1.200 11/11/2019   Lab Results  Component Value Date   WBC 8.8 11/21/2020   HGB 16.5 11/21/2020   HCT 48.9 11/21/2020   MCV 85 11/21/2020   PLT 284 11/21/2020   Lab Results  Component Value Date   NA 143 11/21/2020   K 4.7 11/21/2020   CO2 22 11/21/2020   GLUCOSE 177 (H) 11/21/2020   BUN 19 11/21/2020   CREATININE 1.27 11/21/2020   BILITOT 0.6 03/29/2020   ALKPHOS 42 03/29/2020   AST 23 03/29/2020   ALT 32 03/29/2020   PROT 7.0 03/29/2020   ALBUMIN 4.2 03/29/2020   CALCIUM 10.2 11/21/2020   ANIONGAP 10 03/29/2020   EGFR 63 11/21/2020   Lab Results  Component Value Date   CHOL 149 11/11/2019   Lab Results  Component Value Date   HDL 37 (L) 11/11/2019   Lab Results  Component Value Date   LDLCALC 77 11/11/2019   Lab Results  Component Value Date   TRIG 211 (H) 11/11/2019   Lab Results  Component Value Date   CHOLHDL 4.0 11/11/2019   Lab Results  Component Value Date   HGBA1C 9.2 (H) 11/21/2020      Assessment & Plan:   Problem List Items Addressed This Visit       Cardiovascular and Mediastinum   Benign hypertension with CKD (chronic kidney disease) stage III (HCC)   Relevant Medications   amLODipine-benazepril (LOTREL) 5-20 MG capsule   atorvastatin (LIPITOR) 20 MG tablet   fenofibrate (TRICOR) 145 MG tablet   Other Relevant Orders   CBC with Differential/Platelet   Comprehensive metabolic panel     Endocrine   Type 2 diabetes mellitus with stage 3 chronic kidney  disease, without long-term current use of insulin (HCC)   Relevant Medications   amLODipine-benazepril (LOTREL) 5-20 MG capsule   glimepiride (AMARYL) 2 MG tablet   atorvastatin (LIPITOR) 20 MG tablet   fenofibrate (TRICOR) 145 MG tablet   metFORMIN (GLUCOPHAGE) 1000 MG tablet   Other Relevant Orders   CBC with Differential/Platelet   Comprehensive metabolic panel   Lipid panel   Hemoglobin A1c   Other Visit Diagnoses     Encounter for screening for other viral diseases    -  Primary   Relevant Orders   Hepatitis C Antibody   Screening PSA (prostate specific antigen)       Relevant Orders   PSA, Medicare ( [place] only)   Gout, unspecified cause, unspecified chronicity, unspecified site       Relevant Medications   allopurinol (ZYLOPRIM) 100 MG tablet       Meds ordered this encounter  Medications   amLODipine-benazepril (LOTREL) 5-20 MG capsule    Sig: Take 1 capsule by mouth daily.    Dispense:  90 capsule    Refill:  1    Order Specific Question:   Supervising Provider    Answer:   Carlota Raspberry, JEFFREY R [[id number]]   glimepiride (AMARYL) 2 MG tablet    Sig: Take 1 tablet (2 mg total) by mouth 2 (two) times daily before a meal. glimepiride 2 mg tablet    Dispense:  180 tablet    Refill:  1    Order Specific Question:   Supervising Provider    Answer:   Carlota Raspberry, JEFFREY R [[id number]]   atorvastatin (LIPITOR) 20 MG tablet    Sig: Take 1 tablet (20 mg total) by mouth daily.    Dispense:  90 tablet    Refill:  1    Order Specific Question:   Supervising Provider    Answer:   Carlota Raspberry, JEFFREY R [[id number]]   fenofibrate (TRICOR) 145 MG tablet    Sig: TAKE 1 TABLET ONCE A DAY ORALLY    Dispense:  90 tablet    Refill:  1    Order Specific Question:   Supervising Provider    Answer:   Carlota Raspberry, JEFFREY R [[id number]]   metFORMIN (GLUCOPHAGE) 1000 MG tablet    Sig: TAKE 1/2 TABLET BY MOUTH EVERY MORNING AND 1 & 1/2 TABLETS IN THE EVENING    Dispense:  180 tablet    Refill:  1    Order  Specific Question:   Supervising Provider    Answer:   Carlota Raspberry, JEFFREY R [[id number]]   allopurinol (ZYLOPRIM) 100 MG tablet    Sig: Take 1 tablet (100 mg total) by mouth daily.    Dispense:  90 tablet    Refill:  1    Order Specific Question:   Supervising Provider    Answer:   Carlota Raspberry, JEFFREY R [[id number]]    Follow-up: Return in about 6 months (around 11/12/2021) for htn, hld, t2dm.   PLAN Labs collected. Will follow up with the patient as warranted. Will include screening PSA and screening for Hep C.  BP well controlled. Maintain current regimen. If A1c still above goal will consider Janumet, farxiga, or dulaglutide as options. If A1c still above goal, will recheck in 3 mo Patient encouraged to call clinic with any questions, comments, or concerns.  Maximiano Coss, NP

## 2021-05-16 ENCOUNTER — Encounter: Payer: Self-pay | Admitting: Registered Nurse

## 2021-05-16 LAB — HEPATITIS C ANTIBODY
Hepatitis C Ab: NONREACTIVE
SIGNAL TO CUT-OFF: 0 (ref <1.00)

## 2021-05-16 NOTE — Telephone Encounter (Signed)
Patient is suggesting a new medication for Diabetes states that 8.6 is too high. Did you discuss a new medication?

## 2021-05-17 ENCOUNTER — Other Ambulatory Visit: Payer: Self-pay | Admitting: Registered Nurse

## 2021-05-17 DIAGNOSIS — E1122 Type 2 diabetes mellitus with diabetic chronic kidney disease: Secondary | ICD-10-CM

## 2021-05-17 DIAGNOSIS — N1831 Chronic kidney disease, stage 3a: Secondary | ICD-10-CM

## 2021-05-17 MED ORDER — TRULICITY 1.5 MG/0.5ML ~~LOC~~ SOAJ: 1.5000 mg | SUBCUTANEOUS | 0 refills | Status: DC

## 2021-05-17 MED ORDER — DULAGLUTIDE 0.75 MG/0.5ML ~~LOC~~ SOAJ: 0.7500 mg | SUBCUTANEOUS | 0 refills | Status: DC

## 2021-05-29 ENCOUNTER — Encounter: Payer: Self-pay | Admitting: Registered Nurse

## 2021-05-29 NOTE — Telephone Encounter (Signed)
Scheduled patient for an appointment on Thursday, 05/30/2021

## 2021-05-30 ENCOUNTER — Encounter: Payer: Self-pay | Admitting: Registered Nurse

## 2021-05-30 ENCOUNTER — Telehealth (INDEPENDENT_AMBULATORY_CARE_PROVIDER_SITE_OTHER): Payer: Medicare Other | Admitting: Registered Nurse

## 2021-05-30 ENCOUNTER — Other Ambulatory Visit: Payer: Self-pay

## 2021-05-30 DIAGNOSIS — E1122 Type 2 diabetes mellitus with diabetic chronic kidney disease: Secondary | ICD-10-CM | POA: Diagnosis not present

## 2021-05-30 DIAGNOSIS — N1831 Chronic kidney disease, stage 3a: Secondary | ICD-10-CM

## 2021-05-30 MED ORDER — DULAGLUTIDE 0.75 MG/0.5ML ~~LOC~~ SOAJ: 0.7500 mg | SUBCUTANEOUS | 0 refills | Status: DC

## 2021-05-30 NOTE — Patient Instructions (Signed)
° ° ° °  If you have lab work done today you will be contacted with your lab results within the next 2 weeks.  If you have not heard from us then please contact us. The fastest way to get your results is to register for My Chart. ° ° °IF you received an x-ray today, you will receive an invoice from [facility]. Please contact [facility] at [phone] with questions or concerns regarding your invoice.  ° °IF you received labwork today, you will receive an invoice from [facility]. Please contact [facility] at [phone] with questions or concerns regarding your invoice.  ° °Our billing staff will not be able to assist you with questions regarding bills from these companies. ° °You will be contacted with the lab results as soon as they are available. The fastest way to get your results is to activate your My Chart account. Instructions are located on the last page of this paperwork. If you have not heard from us regarding the results in 2 weeks, please contact this office. °  ° ° ° °

## 2021-06-14 NOTE — Progress Notes (Signed)
Telemedicine Encounter- SOAP NOTE Established Patient  This telephone encounter was conducted with the patient's (or proxy's) verbal consent via audio telecommunications: yes/no: Yes Patient was instructed to have this encounter in a suitably private space; and to only have persons present to whom they give permission to participate. In addition, patient identity was confirmed by use of name plus two identifiers (DOB and address).  I discussed the limitations, risks, security and privacy concerns of performing an evaluation and management service by telephone and the availability of in person appointments. I also discussed with the patient that there may be a patient responsible charge related to this service. The patient expressed understanding and agreed to proceed.  I spent a total of 12 talking with the patient or their proxy.  Patient at home Provider in office  Participants: Samuel Ruddy, NP and Samuel Sanders  Chief Complaint  Patient presents with   Blood Sugar Problem    Patient states he has started on Trulicity and blood sugar has been extremely low 100's in the morning but it has been in the afternoon blood sugar is in the 50's     Subjective   Samuel Sanders is a 65 y.o. established patient. Telephone visit today for blood sugar  HPI Noted hypoglycemia since starting trulicity. Normally in low 100s but at times now down to 50-60. Has been on metformin and glimepiride as well Plans to stop glimepiride  No AE, notes some shakiness and clamminess when sugars very low but otherwise no concerns.  Patient Active Problem List   Diagnosis Date Noted   Obesity (BMI 30-39.9) 11/14/2020   Lumbar radiculopathy 04/17/2020   Stage 3b chronic kidney disease ([place]) 11/14/2019   Dupuytren's disease of palm 09/27/2018   Pain in finger of left hand 09/27/2018   Benign hypertension with CKD (chronic kidney disease) stage III ([place]) 12/11/2015   Type 2 diabetes mellitus with stage 3  chronic kidney disease, without long-term current use of insulin ([place]) 12/11/2015    Past Medical History:  Diagnosis Date   Essential hypertension    Hx of adenomatous colonic polyps 01/2020   Hyperlipidemia    Type 2 diabetes mellitus (HCC)     Current Outpatient Medications  Medication Sig Dispense Refill   allopurinol (ZYLOPRIM) 100 MG tablet Take 1 tablet (100 mg total) by mouth daily. 90 tablet 1   amLODipine-benazepril (LOTREL) 5-20 MG capsule Take 1 capsule by mouth daily. 90 capsule 1   aspirin EC 81 MG tablet Take 81 mg by mouth daily.     atorvastatin (LIPITOR) 20 MG tablet Take 1 tablet (20 mg total) by mouth daily. 90 tablet 1   Dulaglutide (TRULICITY) 1.5 UV/2.5DG SOPN Inject 1.5 mg into the skin once a week. 4 mL 0   fenofibrate (TRICOR) 145 MG tablet TAKE 1 TABLET ONCE A DAY ORALLY 90 tablet 1   glimepiride (AMARYL) 2 MG tablet Take 1 tablet (2 mg total) by mouth 2 (two) times daily before a meal. glimepiride 2 mg tablet 180 tablet 1   metFORMIN (GLUCOPHAGE) 1000 MG tablet TAKE 1/2 TABLET BY MOUTH EVERY MORNING AND 1 & 1/2 TABLETS IN THE EVENING 180 tablet 1   Omega-3 1000 MG CAPS Take by mouth.     testosterone cypionate (DEPOTESTOSTERONE CYPIONATE) 200 MG/ML injection Inject 200 mg into the muscle once a week.     Dulaglutide 0.75 MG/0.5ML SOPN Inject 0.75 mg into the skin once a week. 2 mL 0   No current  facility-administered medications for this visit.    No Known Allergies  Social History   Socioeconomic History   Marital status: Married    Spouse name: Not on file   Number of children: Not on file   Years of education: Not on file   Highest education level: Not on file  Occupational History   Not on file  Tobacco Use   Smoking status: Former    Types: Cigarettes    Quit date: 06/28/2004    Years since quitting: 16.9   Smokeless tobacco: Never  Vaping Use   Vaping Use: Never used  Substance and Sexual Activity   Alcohol use: Yes    Comment: rare  beer monthly or so   Drug use: Not Currently   Sexual activity: Yes  Other Topics Concern   Not on file  Social History Narrative   Not on file   Social Determinants of Health   Financial Resource Strain: Not on file  Food Insecurity: Not on file  Transportation Needs: Not on file  Physical Activity: Not on file  Stress: Not on file  Social Connections: Not on file  Intimate Partner Violence: Not on file    ROS Per hpi   Objective   Vitals as reported by the patient: There were no vitals filed for this visit.  Samuel Sanders was seen today for blood sugar problem.  Diagnoses and all orders for this visit:  Type 2 diabetes mellitus with stage 3a chronic kidney disease, without long-term current use of insulin (HCC) -     Dulaglutide 0.75 MG/0.5ML SOPN; Inject 0.75 mg into the skin once a week.   PLAN Stop glimepiride, continue trulicity Return as scheduled for t2dm check Continue home checks on sugars Patient encouraged to call clinic with any questions, comments, or concerns.   I discussed the assessment and treatment plan with the patient. The patient was provided an opportunity to ask questions and all were answered. The patient agreed with the plan and demonstrated an understanding of the instructions.   The patient was advised to call back or seek an in-person evaluation if the symptoms worsen or if the condition fails to improve as anticipated.  I provided 12 minutes of non-face-to-face time during this encounter.  Samuel Coss, NP

## 2021-08-07 ENCOUNTER — Ambulatory Visit: Payer: Medicare Other | Admitting: Registered Nurse

## 2021-08-07 ENCOUNTER — Other Ambulatory Visit: Payer: Self-pay | Admitting: Registered Nurse

## 2021-08-07 DIAGNOSIS — N1831 Chronic kidney disease, stage 3a: Secondary | ICD-10-CM

## 2021-08-07 MED ORDER — TRULICITY 1.5 MG/0.5ML ~~LOC~~ SOAJ: 1.5000 mg | SUBCUTANEOUS | 0 refills | Status: DC

## 2021-08-21 ENCOUNTER — Ambulatory Visit (INDEPENDENT_AMBULATORY_CARE_PROVIDER_SITE_OTHER): Payer: Medicare Other | Admitting: Registered Nurse

## 2021-08-21 ENCOUNTER — Encounter: Payer: Self-pay | Admitting: Registered Nurse

## 2021-08-21 VITALS — BP 132/74 | HR 61 | Ht 70.0 in | Wt 215.4 lb

## 2021-08-21 DIAGNOSIS — N1831 Chronic kidney disease, stage 3a: Secondary | ICD-10-CM | POA: Diagnosis not present

## 2021-08-21 DIAGNOSIS — E1122 Type 2 diabetes mellitus with diabetic chronic kidney disease: Secondary | ICD-10-CM | POA: Diagnosis not present

## 2021-08-21 DIAGNOSIS — N183 Chronic kidney disease, stage 3 unspecified: Secondary | ICD-10-CM

## 2021-08-21 DIAGNOSIS — M109 Gout, unspecified: Secondary | ICD-10-CM

## 2021-08-21 DIAGNOSIS — I129 Hypertensive chronic kidney disease with stage 1 through stage 4 chronic kidney disease, or unspecified chronic kidney disease: Secondary | ICD-10-CM | POA: Diagnosis not present

## 2021-08-21 DIAGNOSIS — Z23 Encounter for immunization: Secondary | ICD-10-CM

## 2021-08-21 LAB — CBC WITH DIFFERENTIAL/PLATELET
Basophils Absolute: 0.1 10*3/uL (ref 0.0–0.1)
Basophils Relative: 0.7 % (ref 0.0–3.0)
Eosinophils Absolute: 0.3 10*3/uL (ref 0.0–0.7)
Eosinophils Relative: 3.6 % (ref 0.0–5.0)
HCT: 44.5 % (ref 39.0–52.0)
Hemoglobin: 14.9 g/dL (ref 13.0–17.0)
Lymphocytes Relative: 24.3 % (ref 12.0–46.0)
Lymphs Abs: 2.1 10*3/uL (ref 0.7–4.0)
MCHC: 33.4 g/dL (ref 30.0–36.0)
MCV: 85.1 fl (ref 78.0–100.0)
Monocytes Absolute: 0.5 10*3/uL (ref 0.1–1.0)
Monocytes Relative: 5.7 % (ref 3.0–12.0)
Neutro Abs: 5.6 10*3/uL (ref 1.4–7.7)
Neutrophils Relative %: 65.7 % (ref 43.0–77.0)
Platelets: 205 10*3/uL (ref 150.0–400.0)
RBC: 5.23 Mil/uL (ref 4.22–5.81)
RDW: 12.7 % (ref 11.5–15.5)
WBC: 8.5 10*3/uL (ref 4.0–10.5)

## 2021-08-21 LAB — COMPREHENSIVE METABOLIC PANEL
ALT: 35 U/L (ref 0–53)
AST: 23 U/L (ref 0–37)
Albumin: 4.4 g/dL (ref 3.5–5.2)
Alkaline Phosphatase: 43 U/L (ref 39–117)
BUN: 19 mg/dL (ref 6–23)
CO2: 30 mEq/L (ref 19–32)
Calcium: 9.5 mg/dL (ref 8.4–10.5)
Chloride: 102 mEq/L (ref 96–112)
Creatinine, Ser: 1.28 mg/dL (ref 0.40–1.50)
GFR: 58.37 mL/min — ABNORMAL LOW (ref >60.00)
Glucose, Bld: 97 mg/dL (ref 70–99)
Potassium: 4.3 mEq/L (ref 3.5–5.1)
Sodium: 139 mEq/L (ref 135–145)
Total Bilirubin: 0.6 mg/dL (ref 0.2–1.2)
Total Protein: 6.7 g/dL (ref 6.0–8.3)

## 2021-08-21 LAB — LIPID PANEL
Cholesterol: 127 mg/dL (ref 0–200)
HDL: 33.4 mg/dL — ABNORMAL LOW (ref >39.00)
LDL Cholesterol: 64 mg/dL (ref 0–99)
NonHDL: 93.77
Total CHOL/HDL Ratio: 4
Triglycerides: 147 mg/dL (ref 0.0–149.0)
VLDL: 29.4 mg/dL (ref 0.0–40.0)

## 2021-08-21 LAB — HEMOGLOBIN A1C: Hgb A1c MFr Bld: 6.9 % — ABNORMAL HIGH (ref 4.6–6.5)

## 2021-08-21 MED ORDER — AMLODIPINE BESY-BENAZEPRIL HCL 5-20 MG PO CAPS: 1.0000 | ORAL_CAPSULE | Freq: Every day | ORAL | 1 refills | Status: DC

## 2021-08-21 MED ORDER — GLIMEPIRIDE 2 MG PO TABS
2.0000 mg | ORAL_TABLET | Freq: Two times a day (BID) | ORAL | 1 refills | Status: DC
Start: 2021-08-21 — End: 2022-02-25

## 2021-08-21 MED ORDER — ALLOPURINOL 100 MG PO TABS
100.0000 mg | ORAL_TABLET | Freq: Every day | ORAL | 1 refills | Status: DC
Start: 2021-08-21 — End: 2022-02-25

## 2021-08-21 MED ORDER — METFORMIN HCL 1000 MG PO TABS: ORAL_TABLET | ORAL | 1 refills | Status: DC

## 2021-08-21 MED ORDER — ATORVASTATIN CALCIUM 20 MG PO TABS: 20.0000 mg | ORAL_TABLET | Freq: Every day | ORAL | 1 refills | Status: DC

## 2021-08-21 NOTE — Progress Notes (Signed)
Established Patient Office Visit  Subjective:  Patient ID: Samuel Sanders, male    DOB: 1956/05/26  Age: 65 y.o. MRN: [id number]  CC:  Chief Complaint  Patient presents with   Medication Check    HPI Samuel Sanders presents for t2dm, htn, hld  Last A1c:  Lab Results  Component Value Date   HGBA1C 8.6 (H) 05/15/2021    Currently taking: glimepiride 75m po qd, trulicity 11.6BWsubq weekly, metformin 10030mpo bid ac. No new complications Reports good compliance with medications Diet has been much improved since last visit. Exercise habits have been limited in cooler months Sugars have ranged 65- mid 100s.   Hypertension: Patient Currently taking: amlodipine-benazepril 5-2018mo qd Good effect. No AEs. Denies CV symptoms including: chest pain, shob, doe, headache, visual changes, fatigue, claudication, and dependent edema.   Previous readings and labs: BP Readings from Last 3 Encounters:  08/21/21 132/74  05/15/21 122/80  11/16/20 (!) 142/82   Lab Results  Component Value Date   CREATININE 1.39 05/15/2021      Past Medical History:  Diagnosis Date   Essential hypertension    Hx of adenomatous colonic polyps 01/2020   Hyperlipidemia    [place] 2 [place]   Past Surgical History:  Procedure Laterality Date   COLONOSCOPY  2009   normal   KNEE ARTHROSCOPY Left 1983   TENDON REPAIR Right 1990   wrist   TONSILLECTOMY  1958    Family History  Problem Relation Age of Onset   Cancer Mother    Healthy Father    Brain cancer Sister    Colon cancer Neg Hx    Colon polyps Neg Hx    Esophageal cancer Neg Hx    Rectal cancer Neg Hx    Stomach cancer Neg Hx     Social History   Socioeconomic History   Marital status: Married    Spouse name: Not on file   Number of children: Not on file   Years of education: Not on file   Highest education level: Not on file  Occupational History   Not on file  Tobacco Use   Smoking status: Former     Types: Cigarettes    Quit date: 06/28/2004    Years since quitting: 17.1   Smokeless tobacco: Never  Vaping Use   Vaping Use: Never used  Substance and Sexual Activity   Alcohol use: Yes    Comment: rare beer monthly or so   Drug use: Not Currently   Sexual activity: Yes  Other Topics Concern   Not on file  Social History Narrative   Not on file   Social Determinants of Health   Financial Resource Strain: Not on file  Food Insecurity: Not on file  Transportation Needs: Not on file  Physical Activity: Not on file  Stress: Not on file  Social Connections: Not on file  Intimate Partner Violence: Not on file    Outpatient Medications Prior to Visit  Medication Sig Dispense Refill   aspirin EC 81 MG tablet Take 81 mg by mouth daily.     Dulaglutide (TRULICITY) 1.5 MG/GY/6.5LDPN Inject 1.5 mg into the skin once a week. 4 mL 0   fenofibrate (TRICOR) 145 MG tablet TAKE 1 TABLET ONCE A DAY ORALLY 90 tablet 1   Omega-3 1000 MG CAPS Take by mouth.     testosterone cypionate (DEPOTESTOSTERONE CYPIONATE) 200 MG/ML injection Inject 200 mg into the muscle once  a week.     allopurinol (ZYLOPRIM) 100 MG tablet Take 1 tablet (100 mg total) by mouth daily. 90 tablet 1   amLODipine-benazepril (LOTREL) 5-20 MG capsule Take 1 capsule by mouth daily. 90 capsule 1   atorvastatin (LIPITOR) 20 MG tablet Take 1 tablet (20 mg total) by mouth daily. 90 tablet 1   glimepiride (AMARYL) 2 MG tablet Take 1 tablet (2 mg total) by mouth 2 (two) times daily before a meal. glimepiride 2 mg tablet 180 tablet 1   metFORMIN (GLUCOPHAGE) 1000 MG tablet TAKE 1/2 TABLET BY MOUTH EVERY MORNING AND 1 & 1/2 TABLETS IN THE EVENING 180 tablet 1   Dulaglutide 0.75 MG/0.5ML SOPN Inject 0.75 mg into the skin once a week. 2 mL 0   No facility-administered medications prior to visit.    No Known Allergies  ROS Review of Systems  Constitutional: Negative.   HENT: Negative.    Eyes: Negative.   Respiratory: Negative.     Cardiovascular: Negative.   Gastrointestinal: Negative.   Genitourinary: Negative.   Musculoskeletal: Negative.   Skin: Negative.   Neurological: Negative.   Psychiatric/Behavioral: Negative.    All other systems reviewed and are negative.    Objective:    Physical Exam Constitutional:      General: He is not in acute distress.    Appearance: Normal appearance. He is normal weight. He is not ill-appearing, toxic-appearing or diaphoretic.  Cardiovascular:     Rate and Rhythm: Normal rate and regular rhythm.     Heart sounds: Normal heart sounds. No murmur heard.   No friction rub. No gallop.  Pulmonary:     Effort: Pulmonary effort is normal. No respiratory distress.     Breath sounds: Normal breath sounds. No stridor. No wheezing, rhonchi or rales.  Chest:     Chest wall: No tenderness.  Neurological:     General: No focal deficit present.     Mental Status: He is alert and oriented to person, place, and time. Mental status is at baseline.  Psychiatric:        Mood and Affect: Mood normal.        Behavior: Behavior normal.        Thought Content: Thought content normal.        Judgment: Judgment normal.    BP 132/74    Pulse 61    Ht 5' 10" (1.778 m)    Wt 215 lb 6.4 oz (97.7 kg)    SpO2 98%    BMI 30.91 kg/m  Wt Readings from Last 3 Encounters:  08/21/21 215 lb 6.4 oz (97.7 kg)  05/15/21 226 lb 12.8 oz (102.9 kg)  11/16/20 228 lb 3.2 oz (103.5 kg)     Health Maintenance Due  Topic Date Due   Pneumonia Vaccine 55+ Years old (1 - PCV) Never done   COVID-19 Vaccine (3 - Booster for Pfizer series) 01/31/2020   OPHTHALMOLOGY EXAM  03/30/2021    There are no preventive care reminders to display for this patient.  Lab Results  Component Value Date   TSH 1.200 11/11/2019   Lab Results  Component Value Date   WBC 8.2 05/15/2021   HGB 15.6 05/15/2021   HCT 45.6 05/15/2021   MCV 85.0 05/15/2021   PLT 231.0 05/15/2021   Lab Results  Component Value Date   NA  138 05/15/2021   K 4.7 05/15/2021   CO2 26 05/15/2021   GLUCOSE 160 (H) 05/15/2021   BUN 25 (H) 05/15/2021  CREATININE 1.39 05/15/2021   BILITOT 0.6 05/15/2021   ALKPHOS 46 05/15/2021   AST 15 05/15/2021   ALT 22 05/15/2021   PROT 6.7 05/15/2021   ALBUMIN 4.3 05/15/2021   CALCIUM 9.8 05/15/2021   ANIONGAP 10 03/29/2020   EGFR 63 11/21/2020   GFR 52.97 (L) 05/15/2021   Lab Results  Component Value Date   CHOL 138 05/15/2021   Lab Results  Component Value Date   HDL 31.40 (L) 05/15/2021   Lab Results  Component Value Date   LDLCALC 70 05/15/2021   Lab Results  Component Value Date   TRIG 185.0 (H) 05/15/2021   Lab Results  Component Value Date   CHOLHDL 4 05/15/2021   Lab Results  Component Value Date   HGBA1C 8.6 (H) 05/15/2021      Assessment & Plan:   Problem List Items Addressed This Visit       Cardiovascular and Mediastinum   Benign hypertension with CKD (chronic kidney disease) stage III (HCC)   Relevant Medications   atorvastatin (LIPITOR) 20 MG tablet   amLODipine-benazepril (LOTREL) 5-20 MG capsule     Endocrine   Type 2 diabetes mellitus with stage 3a chronic kidney disease, without long-term current use of insulin (HCC)   Relevant Medications   atorvastatin (LIPITOR) 20 MG tablet   glimepiride (AMARYL) 2 MG tablet   amLODipine-benazepril (LOTREL) 5-20 MG capsule   metFORMIN (GLUCOPHAGE) 1000 MG tablet   Other Relevant Orders   CBC with Differential/Platelet   Comprehensive metabolic panel   Lipid panel   Hemoglobin A1c   Other Visit Diagnoses     Need for pneumococcal vaccination    -  Primary   Relevant Orders   Pneumococcal conjugate vaccine 20-valent (Prevnar 20)   Gout, unspecified cause, unspecified chronicity, unspecified site       Relevant Medications   allopurinol (ZYLOPRIM) 100 MG tablet       Meds ordered this encounter  Medications   atorvastatin (LIPITOR) 20 MG tablet    Sig: Take 1 tablet (20 mg total) by mouth  daily.    Dispense:  90 tablet    Refill:  1    Order Specific Question:   Supervising Provider    Answer:   Carlota Raspberry, JEFFREY R [[id number]]   glimepiride (AMARYL) 2 MG tablet    Sig: Take 1 tablet (2 mg total) by mouth 2 (two) times daily before a meal. glimepiride 2 mg tablet    Dispense:  180 tablet    Refill:  1    Order Specific Question:   Supervising Provider    Answer:   Carlota Raspberry, JEFFREY R [[id number]]   allopurinol (ZYLOPRIM) 100 MG tablet    Sig: Take 1 tablet (100 mg total) by mouth daily.    Dispense:  90 tablet    Refill:  1    Order Specific Question:   Supervising Provider    Answer:   Carlota Raspberry, JEFFREY R [[id number]]   amLODipine-benazepril (LOTREL) 5-20 MG capsule    Sig: Take 1 capsule by mouth daily.    Dispense:  90 capsule    Refill:  1    Order Specific Question:   Supervising Provider    Answer:   Carlota Raspberry, JEFFREY R [[id number]]   metFORMIN (GLUCOPHAGE) 1000 MG tablet    Sig: TAKE 1/2 TABLET BY MOUTH EVERY MORNING AND 1 & 1/2 TABLETS IN THE EVENING    Dispense:  180 tablet    Refill:  1    Order Specific  Question:   Supervising Provider    Answer:   Merri Ray R [[id number]]    Follow-up: Return in about 3 months (around 11/19/2021) for t2dm.   PLAN Refill meds Return in 3 mo, can extend to 6 mo pending labs Commended patient on positive improvements, weight loss, and adequate control of chronic conditions Patient encouraged to call clinic with any questions, comments, or concerns.   Maximiano Coss, NP

## 2021-08-21 NOTE — Patient Instructions (Signed)
Mr. Byrns -   GREAT WORK!  You're looking fit! Keep it up. The blood pressure and scale reflect the positive changes, I'm sure the labs will as well.   See you in 3 mo - if A1c around 7 or below, we can extend this out to 6 months.  Call sooner if you need anything  Merry Christmas, Happy new year!  Rich

## 2021-09-10 ENCOUNTER — Other Ambulatory Visit: Payer: Self-pay | Admitting: Family Medicine

## 2021-09-10 DIAGNOSIS — E1122 Type 2 diabetes mellitus with diabetic chronic kidney disease: Secondary | ICD-10-CM

## 2021-09-10 DIAGNOSIS — N1831 Chronic kidney disease, stage 3a: Secondary | ICD-10-CM

## 2021-10-03 ENCOUNTER — Other Ambulatory Visit: Payer: Self-pay | Admitting: Registered Nurse

## 2021-10-03 DIAGNOSIS — E1122 Type 2 diabetes mellitus with diabetic chronic kidney disease: Secondary | ICD-10-CM

## 2021-10-03 DIAGNOSIS — N1831 Chronic kidney disease, stage 3a: Secondary | ICD-10-CM

## 2021-11-26 ENCOUNTER — Ambulatory Visit: Payer: Medicare Other | Admitting: Registered Nurse

## 2021-12-02 ENCOUNTER — Other Ambulatory Visit: Payer: Self-pay | Admitting: Registered Nurse

## 2021-12-02 DIAGNOSIS — E1122 Type 2 diabetes mellitus with diabetic chronic kidney disease: Secondary | ICD-10-CM

## 2022-01-06 ENCOUNTER — Telehealth: Payer: Self-pay | Admitting: Registered Nurse

## 2022-01-06 NOTE — Telephone Encounter (Signed)
Left message for patient to call back and schedule Medicare Annual Wellness Visit (AWV).   Please offer to do virtually or by telephone.  Left office number and my jabber #[phone].  AWVI eligible as of  03/01/2021  Please schedule at anytime with Nurse Health Advisor.   

## 2022-02-19 ENCOUNTER — Ambulatory Visit: Payer: Medicare Other | Admitting: Registered Nurse

## 2022-02-25 ENCOUNTER — Encounter: Payer: Self-pay | Admitting: Registered Nurse

## 2022-02-25 ENCOUNTER — Ambulatory Visit (INDEPENDENT_AMBULATORY_CARE_PROVIDER_SITE_OTHER): Payer: Medicare Other | Admitting: Registered Nurse

## 2022-02-25 VITALS — BP 124/72 | HR 74 | Temp 98.0°F | Resp 18 | Ht 70.0 in | Wt 221.2 lb

## 2022-02-25 DIAGNOSIS — N183 Chronic kidney disease, stage 3 unspecified: Secondary | ICD-10-CM

## 2022-02-25 DIAGNOSIS — E1122 Type 2 diabetes mellitus with diabetic chronic kidney disease: Secondary | ICD-10-CM | POA: Diagnosis not present

## 2022-02-25 DIAGNOSIS — I129 Hypertensive chronic kidney disease with stage 1 through stage 4 chronic kidney disease, or unspecified chronic kidney disease: Secondary | ICD-10-CM | POA: Diagnosis not present

## 2022-02-25 DIAGNOSIS — M109 Gout, unspecified: Secondary | ICD-10-CM | POA: Diagnosis not present

## 2022-02-25 DIAGNOSIS — N1831 Chronic kidney disease, stage 3a: Secondary | ICD-10-CM | POA: Diagnosis not present

## 2022-02-25 LAB — CBC WITH DIFFERENTIAL/PLATELET
Basophils Absolute: 0.1 10*3/uL (ref 0.0–0.1)
Basophils Relative: 0.6 % (ref 0.0–3.0)
Eosinophils Absolute: 0.3 10*3/uL (ref 0.0–0.7)
Eosinophils Relative: 3.3 % (ref 0.0–5.0)
HCT: 48.1 % (ref 39.0–52.0)
Hemoglobin: 16.2 g/dL (ref 13.0–17.0)
Lymphocytes Relative: 21 % (ref 12.0–46.0)
Lymphs Abs: 2 10*3/uL (ref 0.7–4.0)
MCHC: 33.8 g/dL (ref 30.0–36.0)
MCV: 87.6 fl (ref 78.0–100.0)
Monocytes Absolute: 0.5 10*3/uL (ref 0.1–1.0)
Monocytes Relative: 4.7 % (ref 3.0–12.0)
Neutro Abs: 6.8 10*3/uL (ref 1.4–7.7)
Neutrophils Relative %: 70.4 % (ref 43.0–77.0)
Platelets: 226 10*3/uL (ref 150.0–400.0)
RBC: 5.49 Mil/uL (ref 4.22–5.81)
RDW: 13.5 % (ref 11.5–15.5)
WBC: 9.6 10*3/uL (ref 4.0–10.5)

## 2022-02-25 LAB — COMPREHENSIVE METABOLIC PANEL
ALT: 26 U/L (ref 0–53)
AST: 20 U/L (ref 0–37)
Albumin: 4.7 g/dL (ref 3.5–5.2)
Alkaline Phosphatase: 43 U/L (ref 39–117)
BUN: 25 mg/dL — ABNORMAL HIGH (ref 6–23)
CO2: 29 mEq/L (ref 19–32)
Calcium: 9.9 mg/dL (ref 8.4–10.5)
Chloride: 102 mEq/L (ref 96–112)
Creatinine, Ser: 1.61 mg/dL — ABNORMAL HIGH (ref 0.40–1.50)
GFR: 44.17 mL/min — ABNORMAL LOW (ref >60.00)
Glucose, Bld: 154 mg/dL — ABNORMAL HIGH (ref 70–99)
Potassium: 4.5 mEq/L (ref 3.5–5.1)
Sodium: 138 mEq/L (ref 135–145)
Total Bilirubin: 0.5 mg/dL (ref 0.2–1.2)
Total Protein: 7.2 g/dL (ref 6.0–8.3)

## 2022-02-25 LAB — LIPID PANEL
Cholesterol: 146 mg/dL (ref 0–200)
HDL: 36 mg/dL — ABNORMAL LOW (ref >39.00)
LDL Cholesterol: 74 mg/dL (ref 0–99)
NonHDL: 110.38
Total CHOL/HDL Ratio: 4
Triglycerides: 183 mg/dL — ABNORMAL HIGH (ref 0.0–149.0)
VLDL: 36.6 mg/dL (ref 0.0–40.0)

## 2022-02-25 LAB — HEMOGLOBIN A1C: Hgb A1c MFr Bld: 7.2 % — ABNORMAL HIGH (ref 4.6–6.5)

## 2022-02-25 MED ORDER — FENOFIBRATE 145 MG PO TABS: ORAL_TABLET | ORAL | 0 refills | Status: DC

## 2022-02-25 MED ORDER — AMLODIPINE BESY-BENAZEPRIL HCL 5-20 MG PO CAPS: 1.0000 | ORAL_CAPSULE | Freq: Every day | ORAL | 1 refills | Status: DC

## 2022-02-25 MED ORDER — TRULICITY 1.5 MG/0.5ML ~~LOC~~ SOAJ: SUBCUTANEOUS | 1 refills | Status: DC

## 2022-02-25 MED ORDER — METFORMIN HCL 1000 MG PO TABS: ORAL_TABLET | ORAL | 1 refills | Status: DC

## 2022-02-25 MED ORDER — ALLOPURINOL 100 MG PO TABS: 100.0000 mg | ORAL_TABLET | Freq: Every day | ORAL | 1 refills | Status: DC

## 2022-02-25 MED ORDER — ATORVASTATIN CALCIUM 20 MG PO TABS: 20.0000 mg | ORAL_TABLET | Freq: Every day | ORAL | 1 refills | Status: DC

## 2022-02-25 MED ORDER — GLIMEPIRIDE 2 MG PO TABS: 2.0000 mg | ORAL_TABLET | Freq: Two times a day (BID) | ORAL | 1 refills | Status: DC

## 2022-02-25 NOTE — Assessment & Plan Note (Signed)
Well-controlled.  Continue current regimen. 

## 2022-04-09 LAB — HM DIABETES EYE EXAM

## 2022-04-16 ENCOUNTER — Telehealth: Payer: Self-pay

## 2022-04-16 NOTE — Telephone Encounter (Signed)
Pt is requesting to transfer his care to a Provider here as Samuel Coss, NP is no longer at the office in [place].  I advised pt I would reach out to him upon approval.  Pt cb [phone]  Please advise

## 2022-04-16 NOTE — Telephone Encounter (Signed)
Thanks

## 2022-05-01 ENCOUNTER — Encounter: Payer: Self-pay | Admitting: Family Medicine

## 2022-05-01 ENCOUNTER — Ambulatory Visit (INDEPENDENT_AMBULATORY_CARE_PROVIDER_SITE_OTHER): Payer: Medicare Other | Admitting: Family Medicine

## 2022-05-01 VITALS — BP 160/86 | HR 70 | Temp 97.8°F | Ht 70.0 in | Wt 226.0 lb

## 2022-05-01 DIAGNOSIS — N183 Chronic kidney disease, stage 3 unspecified: Secondary | ICD-10-CM

## 2022-05-01 DIAGNOSIS — Z8739 Personal history of other diseases of the musculoskeletal system and connective tissue: Secondary | ICD-10-CM | POA: Diagnosis not present

## 2022-05-01 DIAGNOSIS — N1831 Chronic kidney disease, stage 3a: Secondary | ICD-10-CM

## 2022-05-01 DIAGNOSIS — E1122 Type 2 diabetes mellitus with diabetic chronic kidney disease: Secondary | ICD-10-CM

## 2022-05-01 DIAGNOSIS — I129 Hypertensive chronic kidney disease with stage 1 through stage 4 chronic kidney disease, or unspecified chronic kidney disease: Secondary | ICD-10-CM | POA: Diagnosis not present

## 2022-05-01 DIAGNOSIS — E782 Mixed hyperlipidemia: Secondary | ICD-10-CM

## 2022-05-01 MED ORDER — OZEMPIC (0.25 OR 0.5 MG/DOSE) 2 MG/3ML ~~LOC~~ SOPN: 0.2500 mg | PEN_INJECTOR | SUBCUTANEOUS | 0 refills | Status: DC

## 2022-05-01 NOTE — Progress Notes (Signed)
New Patient Office Visit  Subjective    Patient ID: Samuel Sanders, male    DOB: 04-25-1956  Age: 66 y.o. MRN: [id number]  CC:  Chief Complaint  Patient presents with   Transitions Of Care    No concerns, needs to set up appt for physical in December.     HPI Samuel Sanders presents to establish care  Diabetes- metformin taking 1/2 tablet in the morning and 1 1/2 tabs in the evening. Glimepiride '2mg'$  bid and Trulicity weekly. States Trulicity is not really affordable and he would like to switch to a different medication. States he has 3 weeks of Trulicity left.   Diabetic eye exam- [facility] on [address] 6 weeks ago  HTN- BP at home in the 120s-70s  HL- on fenofibrate 145 mg and atorvastatin 20 mg  Gout- Allopurinol 100 mg daily  No gout flares   Testosterone prescribed by Dr. Regan Rakers  Urologist     Outpatient Encounter Medications as of 05/01/2022  Medication Sig   allopurinol (ZYLOPRIM) 100 MG tablet Take 1 tablet (100 mg total) by mouth daily.   amLODipine-benazepril (LOTREL) 5-20 MG capsule Take 1 capsule by mouth daily.   aspirin EC 81 MG tablet Take 81 mg by mouth daily.   atorvastatin (LIPITOR) 20 MG tablet Take 1 tablet (20 mg total) by mouth daily.   Dulaglutide (TRULICITY) 1.5 KX/3.8HW SOPN inject 1.5 mg into the skin once weekly   fenofibrate (TRICOR) 145 MG tablet TAKE ONE TABLET BY MOUTH ONE TIME DAILY   glimepiride (AMARYL) 2 MG tablet Take 1 tablet (2 mg total) by mouth 2 (two) times daily before a meal. glimepiride 2 mg tablet   metFORMIN (GLUCOPHAGE) 1000 MG tablet TAKE 1/2 TABLET BY MOUTH EVERY MORNING AND 1 & 1/2 TABLETS IN THE EVENING   Omega-3 1000 MG CAPS Take by mouth.   Semaglutide,0.25 or 0.'5MG'$ /DOS, (OZEMPIC, 0.25 OR 0.5 MG/DOSE,) 2 MG/3ML SOPN Inject 0.25 mg into the skin once a week. For 4 weeks.   testosterone cypionate (DEPOTESTOSTERONE CYPIONATE) 200 MG/ML injection Inject 200 mg into the muscle once a week.   No  facility-administered encounter medications on file as of 05/01/2022.    Past Medical History:  Diagnosis Date   Essential hypertension    Hx of adenomatous colonic polyps 01/2020   Hyperlipidemia    Type 2 diabetes mellitus ([place])     Past Surgical History:  Procedure Laterality Date   COLONOSCOPY  2009   normal   KNEE ARTHROSCOPY Left 1983   TENDON REPAIR Right 1990   wrist   TONSILLECTOMY  1958    Family History  Problem Relation Age of Onset   Cancer Mother    Healthy Father    Brain cancer Sister    Colon cancer Neg Hx    Colon polyps Neg Hx    Esophageal cancer Neg Hx    Rectal cancer Neg Hx    Stomach cancer Neg Hx     Social History   Socioeconomic History   Marital status: Married    Spouse name: Not on file   Number of children: Not on file   Years of education: Not on file   Highest education level: Not on file  Occupational History   Not on file  Tobacco Use   Smoking status: Former    Types: Cigarettes    Quit date: 06/28/2004    Years since quitting: 17.8   Smokeless tobacco: Never  Vaping Use  Vaping Use: Never used  Substance and Sexual Activity   Alcohol use: Yes    Comment: rare beer monthly or so   Drug use: Not Currently   Sexual activity: Yes  Other Topics Concern   Not on file  Social History Narrative   Not on file   Social Determinants of Health   Financial Resource Strain: Not on file  Food Insecurity: Not on file  Transportation Needs: Not on file  Physical Activity: Not on file  Stress: Not on file  Social Connections: Not on file  Intimate Partner Violence: Not on file    ROS      Objective    BP (!) 160/86 (BP Location: Left Arm, Patient Position: Sitting, Cuff Size: Large)   Pulse 70   Temp 97.8 F (36.6 C) (Temporal)   Ht '5\' 10"'$  (1.778 m)   Wt 226 lb (102.5 kg)   SpO2 98%   BMI 32.43 kg/m   Physical Exam Alert and in no distress.  Cardiac exam shows a regular rate and rhythm.  Lungs are clear to  auscultation.      Assessment & Plan:   Problem List Items Addressed This Visit       Cardiovascular and Mediastinum   Benign hypertension with CKD (chronic kidney disease) stage III ([place])    Controlled. Continue current medications.         Endocrine   Type 2 diabetes mellitus with stage 3a chronic kidney disease, without long-term current use of insulin ([place]) - Primary    I will send in [place] to see if this is more affordable. He will continue Trulicity for the next 3 weeks since he has this at home. Consider Wilder Glade if Ozempic is also expensive for him.       Relevant Medications   Semaglutide,0.25 or 0.'5MG'$ /DOS, (OZEMPIC, 0.25 OR 0.5 MG/DOSE,) 2 MG/3ML SOPN     Other   History of gout    No gout flares in years on allopurinol. Continue medication. Low purine diet discussed.       Mixed hyperlipidemia    Continue medications. Follow up fasting in December       Return for in December for CPE. Med well by [facility] please .   Harland Dingwall, NP-C

## 2022-05-01 NOTE — Assessment & Plan Note (Signed)
No gout flares in years on allopurinol. Continue medication. Low purine diet discussed.

## 2022-05-01 NOTE — Assessment & Plan Note (Signed)
Continue medications. Follow up fasting in December

## 2022-05-01 NOTE — Assessment & Plan Note (Signed)
I will send in Samuel Sanders to see if this is more affordable. He will continue Trulicity for the next 3 weeks since he has this at home. Consider Wilder Glade if Ozempic is also expensive for him.

## 2022-05-01 NOTE — Patient Instructions (Addendum)
It was a pleasure meeting you today.  Thank you for trusting [place] with your health care.  Continue your current medications.  I will send Ozempic to your pharmacy to see if it is more affordable than Trulicity.  Please let me know.  I will see you back for your complete physical exam in December or sooner if needed.

## 2022-05-01 NOTE — Assessment & Plan Note (Signed)
Controlled.  Continue current medications.

## 2022-05-06 ENCOUNTER — Encounter: Payer: Self-pay | Admitting: Family Medicine

## 2022-06-23 ENCOUNTER — Encounter: Payer: Self-pay | Admitting: Family Medicine

## 2022-06-23 DIAGNOSIS — N1831 Chronic kidney disease, stage 3a: Secondary | ICD-10-CM

## 2022-06-23 MED ORDER — FENOFIBRATE 145 MG PO TABS: ORAL_TABLET | ORAL | 0 refills | Status: DC

## 2022-06-23 NOTE — Telephone Encounter (Signed)
Rx sent 

## 2022-06-23 NOTE — Telephone Encounter (Signed)
This will be first fill with you.. ok to refill?

## 2022-07-22 ENCOUNTER — Encounter: Payer: Self-pay | Admitting: Family Medicine

## 2022-07-28 ENCOUNTER — Encounter: Payer: Self-pay | Admitting: Family Medicine

## 2022-07-28 ENCOUNTER — Other Ambulatory Visit: Payer: Self-pay | Admitting: Family Medicine

## 2022-07-28 DIAGNOSIS — N1831 Chronic kidney disease, stage 3a: Secondary | ICD-10-CM

## 2022-07-28 MED ORDER — OZEMPIC (0.25 OR 0.5 MG/DOSE) 2 MG/3ML ~~LOC~~ SOPN: 0.5000 mg | PEN_INJECTOR | SUBCUTANEOUS | 0 refills | Status: DC

## 2022-07-28 NOTE — Telephone Encounter (Signed)
Looks like at last visit you were sending in ozempic to see if it was affordable and to continue on the trulicity for next 3 weeks due to having it at home. On rx it says 0.25 mg for 4 weeks, ok to refill dosage or do we need to move up?

## 2022-08-20 NOTE — Patient Instructions (Incomplete)
Preventive Care 65 Years and Older, Male Preventive care refers to lifestyle choices and visits with your health care provider that can promote health and wellness. Preventive care visits are also called wellness exams. What can I expect for my preventive care visit? Counseling During your preventive care visit, your health care provider may ask about your: Medical history, including: Past medical problems. Family medical history. History of falls. Current health, including: Emotional well-being. Home life and relationship well-being. Sexual activity. Memory and ability to understand (cognition). Lifestyle, including: Alcohol, nicotine or tobacco, and drug use. Access to firearms. Diet, exercise, and sleep habits. Work and work environment. Sunscreen use. Safety issues such as seatbelt and bike helmet use. Physical exam Your health care provider will check your: Height and weight. These may be used to calculate your BMI (body mass index). BMI is a measurement that tells if you are at a healthy weight. Waist circumference. This measures the distance around your waistline. This measurement also tells if you are at a healthy weight and may help predict your risk of certain diseases, such as type 2 diabetes and high blood pressure. Heart rate and blood pressure. Body temperature. Skin for abnormal spots. What immunizations do I need?  Vaccines are usually given at various ages, according to a schedule. Your health care provider will recommend vaccines for you based on your age, medical history, and lifestyle or other factors, such as travel or where you work. What tests do I need? Screening Your health care provider may recommend screening tests for certain conditions. This may include: Lipid and cholesterol levels. Diabetes screening. This is done by checking your blood sugar (glucose) after you have not eaten for a while (fasting). Hepatitis C test. Hepatitis B test. HIV (human  immunodeficiency virus) test. STI (sexually transmitted infection) testing, if you are at risk. Lung cancer screening. Colorectal cancer screening. Prostate cancer screening. Abdominal aortic aneurysm (AAA) screening. You may need this if you are a current or former smoker. Talk with your health care provider about your test results, treatment options, and if necessary, the need for more tests. Follow these instructions at home: Eating and drinking  Eat a diet that includes fresh fruits and vegetables, whole grains, lean protein, and low-fat dairy products. Limit your intake of foods with high amounts of sugar, saturated fats, and salt. Take vitamin and mineral supplements as recommended by your health care provider. Do not drink alcohol if your health care provider tells you not to drink. If you drink alcohol: Limit how much you have to 0-2 drinks a day. Know how much alcohol is in your drink. In the [place] one drink equals one 12 oz bottle of beer (355 mL), one 5 oz glass of wine (148 mL), or one 1 oz glass of hard liquor (44 mL). Lifestyle Brush your teeth every morning and night with fluoride toothpaste. Floss one time each day. Exercise for at least 30 minutes 5 or more days each week. Do not use any products that contain nicotine or tobacco. These products include cigarettes, chewing tobacco, and vaping devices, such as e-cigarettes. If you need help quitting, ask your health care provider. Do not use drugs. If you are sexually active, practice safe sex. Use a condom or other form of protection to prevent STIs. Take aspirin only as told by your health care provider. Make sure that you understand how much to take and what form to take. Work with your health care provider to find out whether it is safe   and beneficial for you to take aspirin daily. Ask your health care provider if you need to take a cholesterol-lowering medicine (statin). Find healthy ways to manage stress, such  as: Meditation, yoga, or listening to music. Journaling. Talking to a trusted person. Spending time with friends and family. Safety Always wear your seat belt while driving or riding in a vehicle. Do not drive: If you have been drinking alcohol. Do not ride with someone who has been drinking. When you are tired or distracted. While texting. If you have been using any mind-altering substances or drugs. Wear a helmet and other protective equipment during sports activities. If you have firearms in your house, make sure you follow all gun safety procedures. Minimize exposure to UV radiation to reduce your risk of skin cancer. What's next? Visit your health care provider once a year for an annual wellness visit. Ask your health care provider how often you should have your eyes and teeth checked. Stay up to date on all vaccines. This information is not intended to replace advice given to you by your health care provider. Make sure you discuss any questions you have with your health care provider. Document Revised: 02/13/2021 Document Reviewed: 02/13/2021 Elsevier Patient Education  2023 [employer]  

## 2022-08-20 NOTE — Progress Notes (Unsigned)
   Subjective:    Patient ID: Samuel Sanders, male    DOB: Dec 18, 1955, 66 y.o.   MRN: [id number]  HPI No chief complaint on file.  He is fairly new to the practice and here for a complete physical exam. Previous medical care: Last CPE:  Other providers:   Social history: Lives with ***, works as ***,  *** Smoking, drinking alcohol, drug use Diet: *** Exercise: ***  Immunizations:  Health maintenance:   Colonoscopy: Last PSA: Last Dental Exam: Last Eye Exam:  Wears seatbelt always, uses sunscreen, smoke detectors in home and functioning, does not text while driving, feels safe in home environment.  Reviewed allergies, medications, past medical, surgical, family, and social history.    Review of Systems Review of Systems Constitutional: -fever, -chills, -sweats, -unexpected weight change,-fatigue ENT: -runny nose, -ear pain, -sore throat Cardiology:  -chest pain, -palpitations, -edema Respiratory: -cough, -shortness of breath, -wheezing Gastroenterology: -abdominal pain, -nausea, -vomiting, -diarrhea, -constipation  Hematology: -bleeding or bruising problems Musculoskeletal: -arthralgias, -myalgias, -joint swelling, -back pain Ophthalmology: -vision changes Urology: -dysuria, -difficulty urinating, -hematuria, -urinary frequency, -urgency Neurology: -headache, -weakness, -tingling, -numbness       Objective:   Physical Exam There were no vitals taken for this visit.  General Appearance:    Alert, cooperative, no distress, appears stated age  Head:    Normocephalic, without obvious abnormality, atraumatic  Eyes:    PERRL, conjunctiva/corneas clear, EOM's intact, fundi    benign  Ears:    Normal TM's and external ear canals  Nose:   Nares normal, mucosa normal, no drainage or sinus   tenderness  Throat:   Lips, mucosa, and tongue normal; teeth and gums normal  Neck:   Supple, no lymphadenopathy;  thyroid:  no   enlargement/tenderness/nodules; no carotid   bruit  or JVD  Back:    Spine nontender, no curvature, ROM normal, no CVA     tenderness  Lungs:     Clear to auscultation bilaterally without wheezes, rales or     ronchi; respirations unlabored  Chest Wall:    No tenderness or deformity   Heart:    Regular rate and rhythm, S1 and S2 normal, no murmur, rub   or gallop  Breast Exam:    No chest wall tenderness, masses or gynecomastia  Abdomen:     Soft, non-tender, nondistended, normoactive bowel sounds,    no masses, no hepatosplenomegaly  Genitalia:    Normal male external genitalia without lesions.  Testicles without masses.  No inguinal hernias.  Rectal:    Normal sphincter tone, no masses or tenderness; guaiac negative stool.  Prostate smooth, no nodules, not enlarged.  Extremities:   No clubbing, cyanosis or edema  Pulses:   2+ and symmetric all extremities  Skin:   Skin color, texture, turgor normal, no rashes or lesions  Lymph nodes:   Cervical, supraclavicular, and axillary nodes normal  Neurologic:   CNII-XII intact, normal strength, sensation and gait; reflexes 2+ and symmetric throughout          Psych:   Normal mood, affect, hygiene and grooming.         Assessment & Plan:  Encounter for general adult medical examination with abnormal findings  Type 2 diabetes mellitus with stage 3a chronic kidney disease, without long-term current use of insulin (HCC)  Mixed hyperlipidemia  Benign hypertension with CKD (chronic kidney disease) stage III ([place])

## 2022-08-21 ENCOUNTER — Encounter: Payer: Self-pay | Admitting: Family Medicine

## 2022-08-21 ENCOUNTER — Ambulatory Visit (INDEPENDENT_AMBULATORY_CARE_PROVIDER_SITE_OTHER): Payer: Medicare Other | Admitting: Family Medicine

## 2022-08-21 VITALS — BP 140/80 | HR 63 | Temp 97.6°F | Ht 70.0 in | Wt 224.0 lb

## 2022-08-21 DIAGNOSIS — E1122 Type 2 diabetes mellitus with diabetic chronic kidney disease: Secondary | ICD-10-CM | POA: Diagnosis not present

## 2022-08-21 DIAGNOSIS — Z7185 Encounter for immunization safety counseling: Secondary | ICD-10-CM

## 2022-08-21 DIAGNOSIS — E782 Mixed hyperlipidemia: Secondary | ICD-10-CM

## 2022-08-21 DIAGNOSIS — Z23 Encounter for immunization: Secondary | ICD-10-CM | POA: Diagnosis not present

## 2022-08-21 DIAGNOSIS — Z87891 Personal history of nicotine dependence: Secondary | ICD-10-CM | POA: Diagnosis not present

## 2022-08-21 DIAGNOSIS — N1831 Chronic kidney disease, stage 3a: Secondary | ICD-10-CM

## 2022-08-21 DIAGNOSIS — Z0001 Encounter for general adult medical examination with abnormal findings: Secondary | ICD-10-CM | POA: Diagnosis not present

## 2022-08-21 DIAGNOSIS — N183 Chronic kidney disease, stage 3 unspecified: Secondary | ICD-10-CM

## 2022-08-21 DIAGNOSIS — I129 Hypertensive chronic kidney disease with stage 1 through stage 4 chronic kidney disease, or unspecified chronic kidney disease: Secondary | ICD-10-CM

## 2022-08-21 LAB — CBC WITH DIFFERENTIAL/PLATELET
Basophils Absolute: 0 10*3/uL (ref 0.0–0.1)
Basophils Relative: 0.6 % (ref 0.0–3.0)
Eosinophils Absolute: 0.3 10*3/uL (ref 0.0–0.7)
Eosinophils Relative: 3.5 % (ref 0.0–5.0)
HCT: 49.2 % (ref 39.0–52.0)
Hemoglobin: 16.7 g/dL (ref 13.0–17.0)
Lymphocytes Relative: 25.6 % (ref 12.0–46.0)
Lymphs Abs: 2.1 10*3/uL (ref 0.7–4.0)
MCHC: 33.9 g/dL (ref 30.0–36.0)
MCV: 86.3 fl (ref 78.0–100.0)
Monocytes Absolute: 0.5 10*3/uL (ref 0.1–1.0)
Monocytes Relative: 6.3 % (ref 3.0–12.0)
Neutro Abs: 5.3 10*3/uL (ref 1.4–7.7)
Neutrophils Relative %: 64 % (ref 43.0–77.0)
Platelets: 268 10*3/uL (ref 150.0–400.0)
RBC: 5.7 Mil/uL (ref 4.22–5.81)
RDW: 13.6 % (ref 11.5–15.5)
WBC: 8.3 10*3/uL (ref 4.0–10.5)

## 2022-08-21 LAB — COMPREHENSIVE METABOLIC PANEL
ALT: 27 U/L (ref 0–53)
AST: 21 U/L (ref 0–37)
Albumin: 4.8 g/dL (ref 3.5–5.2)
Alkaline Phosphatase: 47 U/L (ref 39–117)
BUN: 25 mg/dL — ABNORMAL HIGH (ref 6–23)
CO2: 29 mEq/L (ref 19–32)
Calcium: 10 mg/dL (ref 8.4–10.5)
Chloride: 101 mEq/L (ref 96–112)
Creatinine, Ser: 1.5 mg/dL (ref 0.40–1.50)
GFR: 47.92 mL/min — ABNORMAL LOW (ref >60.00)
Glucose, Bld: 137 mg/dL — ABNORMAL HIGH (ref 70–99)
Potassium: 4.2 mEq/L (ref 3.5–5.1)
Sodium: 139 mEq/L (ref 135–145)
Total Bilirubin: 0.5 mg/dL (ref 0.2–1.2)
Total Protein: 7.5 g/dL (ref 6.0–8.3)

## 2022-08-21 LAB — VITAMIN B12: Vitamin B-12: 270 pg/mL (ref 211–911)

## 2022-08-21 LAB — HEMOGLOBIN A1C: Hgb A1c MFr Bld: 8 % — ABNORMAL HIGH (ref 4.6–6.5)

## 2022-08-21 LAB — LIPID PANEL
Cholesterol: 137 mg/dL (ref 0–200)
HDL: 34.1 mg/dL — ABNORMAL LOW (ref >39.00)
LDL Cholesterol: 64 mg/dL (ref 0–99)
NonHDL: 103.16
Total CHOL/HDL Ratio: 4
Triglycerides: 198 mg/dL — ABNORMAL HIGH (ref 0.0–149.0)
VLDL: 39.6 mg/dL (ref 0.0–40.0)

## 2022-08-21 LAB — TSH: TSH: 1.5 u[IU]/mL (ref 0.35–5.50)

## 2022-08-21 LAB — MICROALBUMIN / CREATININE URINE RATIO
Creatinine,U: 146.1 mg/dL
Microalb Creat Ratio: 14.2 mg/g (ref 0.0–30.0)
Microalb, Ur: 20.8 mg/dL — ABNORMAL HIGH (ref 0.0–1.9)

## 2022-08-21 MED ORDER — ATORVASTATIN CALCIUM 20 MG PO TABS: 20.0000 mg | ORAL_TABLET | Freq: Every day | ORAL | 0 refills | Status: DC

## 2022-08-21 NOTE — Progress Notes (Signed)
His A1c is higher at 8.0%. ok to send in higher does of Ozempic x 4 wks. If he is currently taking 0.25 mg weekly, then increase to 0.5 mg weekly x 4 wks and then have him follow up via mychart on how he is doing after 4 weeks.

## 2022-08-22 ENCOUNTER — Other Ambulatory Visit: Payer: Self-pay

## 2022-08-22 DIAGNOSIS — E1122 Type 2 diabetes mellitus with diabetic chronic kidney disease: Secondary | ICD-10-CM

## 2022-08-22 MED ORDER — SEMAGLUTIDE (1 MG/DOSE) 4 MG/3ML ~~LOC~~ SOPN: 1.0000 mg | PEN_INJECTOR | SUBCUTANEOUS | 3 refills | Status: DC

## 2022-08-27 ENCOUNTER — Ambulatory Visit: Payer: Medicare Other | Admitting: Registered Nurse

## 2022-09-09 ENCOUNTER — Other Ambulatory Visit: Payer: Self-pay | Admitting: Family Medicine

## 2022-09-09 DIAGNOSIS — N1831 Chronic kidney disease, stage 3a: Secondary | ICD-10-CM

## 2022-09-10 ENCOUNTER — Telehealth: Payer: Self-pay | Admitting: Family Medicine

## 2022-09-10 ENCOUNTER — Other Ambulatory Visit: Payer: Self-pay | Admitting: Family Medicine

## 2022-09-10 DIAGNOSIS — M109 Gout, unspecified: Secondary | ICD-10-CM

## 2022-09-10 DIAGNOSIS — E1122 Type 2 diabetes mellitus with diabetic chronic kidney disease: Secondary | ICD-10-CM

## 2022-09-10 DIAGNOSIS — I129 Hypertensive chronic kidney disease with stage 1 through stage 4 chronic kidney disease, or unspecified chronic kidney disease: Secondary | ICD-10-CM

## 2022-09-10 MED ORDER — GLIMEPIRIDE 2 MG PO TABS: 2.0000 mg | ORAL_TABLET | Freq: Two times a day (BID) | ORAL | 0 refills | Status: DC

## 2022-09-10 MED ORDER — ALLOPURINOL 100 MG PO TABS: 100.0000 mg | ORAL_TABLET | Freq: Every day | ORAL | 0 refills | Status: DC

## 2022-09-10 MED ORDER — AMLODIPINE BESY-BENAZEPRIL HCL 5-20 MG PO CAPS: 1.0000 | ORAL_CAPSULE | Freq: Every day | ORAL | 0 refills | Status: DC

## 2022-09-10 MED ORDER — METFORMIN HCL 1000 MG PO TABS: ORAL_TABLET | ORAL | 0 refills | Status: DC

## 2022-09-10 NOTE — Telephone Encounter (Signed)
Received incoming fax- rx refills sent

## 2022-09-10 NOTE — Telephone Encounter (Signed)
Patient called that he was trying to get refills on the following medications:  allopurinol (ZYLOPRIM) 100 MG tablet, metFORMIN (GLUCOPHAGE) 1000 MG tablet ,glimepiride (AMARYL) 2 MG tablet . The Pharmacy is stating that he has no refills remaining but on our end it shows he showed have 1 refill remaining for all 3 medications. Patient is requesting these refill for the medications be called in. Patient stated that he only has pills to last him until this Saturday, [place] [address] [place], [place] .  Best callback # for patient is [phone].

## 2022-09-11 ENCOUNTER — Other Ambulatory Visit: Payer: Self-pay | Admitting: Family Medicine

## 2022-09-11 DIAGNOSIS — N1831 Chronic kidney disease, stage 3a: Secondary | ICD-10-CM

## 2022-09-11 DIAGNOSIS — I129 Hypertensive chronic kidney disease with stage 1 through stage 4 chronic kidney disease, or unspecified chronic kidney disease: Secondary | ICD-10-CM

## 2022-09-16 ENCOUNTER — Encounter: Payer: Self-pay | Admitting: Family Medicine

## 2022-09-16 DIAGNOSIS — N1831 Chronic kidney disease, stage 3a: Secondary | ICD-10-CM

## 2022-09-16 MED ORDER — SEMAGLUTIDE (1 MG/DOSE) 4 MG/3ML ~~LOC~~ SOPN: 1.0000 mg | PEN_INJECTOR | SUBCUTANEOUS | 3 refills | Status: DC

## 2022-11-15 ENCOUNTER — Other Ambulatory Visit: Payer: Self-pay | Admitting: Family Medicine

## 2022-11-15 DIAGNOSIS — N1831 Chronic kidney disease, stage 3a: Secondary | ICD-10-CM

## 2022-12-05 ENCOUNTER — Other Ambulatory Visit: Payer: Self-pay | Admitting: Family Medicine

## 2022-12-05 DIAGNOSIS — M109 Gout, unspecified: Secondary | ICD-10-CM

## 2022-12-05 DIAGNOSIS — N183 Chronic kidney disease, stage 3 unspecified: Secondary | ICD-10-CM

## 2022-12-05 DIAGNOSIS — E1122 Type 2 diabetes mellitus with diabetic chronic kidney disease: Secondary | ICD-10-CM

## 2023-01-19 ENCOUNTER — Other Ambulatory Visit: Payer: Self-pay | Admitting: Family Medicine

## 2023-01-19 DIAGNOSIS — N1831 Chronic kidney disease, stage 3a: Secondary | ICD-10-CM

## 2023-02-16 ENCOUNTER — Encounter: Payer: Self-pay | Admitting: Family Medicine

## 2023-02-16 NOTE — Telephone Encounter (Signed)
Do you want pt to fast for upcoming 6 month f/u? Last lipid check was in December

## 2023-02-17 ENCOUNTER — Other Ambulatory Visit: Payer: Self-pay | Admitting: Family Medicine

## 2023-02-17 DIAGNOSIS — N1831 Chronic kidney disease, stage 3a: Secondary | ICD-10-CM

## 2023-02-20 ENCOUNTER — Encounter: Payer: Self-pay | Admitting: Family Medicine

## 2023-02-20 ENCOUNTER — Ambulatory Visit (INDEPENDENT_AMBULATORY_CARE_PROVIDER_SITE_OTHER): Payer: Medicare Other | Admitting: Family Medicine

## 2023-02-20 VITALS — BP 140/76 | HR 68 | Temp 97.8°F | Ht 70.0 in | Wt 219.0 lb

## 2023-02-20 DIAGNOSIS — N1831 Chronic kidney disease, stage 3a: Secondary | ICD-10-CM

## 2023-02-20 DIAGNOSIS — E782 Mixed hyperlipidemia: Secondary | ICD-10-CM | POA: Diagnosis not present

## 2023-02-20 DIAGNOSIS — M109 Gout, unspecified: Secondary | ICD-10-CM | POA: Diagnosis not present

## 2023-02-20 DIAGNOSIS — E1122 Type 2 diabetes mellitus with diabetic chronic kidney disease: Secondary | ICD-10-CM | POA: Diagnosis not present

## 2023-02-20 DIAGNOSIS — N183 Chronic kidney disease, stage 3 unspecified: Secondary | ICD-10-CM

## 2023-02-20 DIAGNOSIS — I129 Hypertensive chronic kidney disease with stage 1 through stage 4 chronic kidney disease, or unspecified chronic kidney disease: Secondary | ICD-10-CM | POA: Diagnosis not present

## 2023-02-20 LAB — CBC WITH DIFFERENTIAL/PLATELET
Basophils Absolute: 0.1 10*3/uL (ref 0.0–0.1)
Basophils Relative: 0.7 % (ref 0.0–3.0)
Eosinophils Absolute: 0.2 10*3/uL (ref 0.0–0.7)
Eosinophils Relative: 2.7 % (ref 0.0–5.0)
HCT: 46.2 % (ref 39.0–52.0)
Hemoglobin: 15.1 g/dL (ref 13.0–17.0)
Lymphocytes Relative: 26.5 % (ref 12.0–46.0)
Lymphs Abs: 2.3 10*3/uL (ref 0.7–4.0)
MCHC: 32.8 g/dL (ref 30.0–36.0)
MCV: 86.7 fl (ref 78.0–100.0)
Monocytes Absolute: 0.5 10*3/uL (ref 0.1–1.0)
Monocytes Relative: 5.7 % (ref 3.0–12.0)
Neutro Abs: 5.5 10*3/uL (ref 1.4–7.7)
Neutrophils Relative %: 64.4 % (ref 43.0–77.0)
Platelets: 249 10*3/uL (ref 150.0–400.0)
RBC: 5.32 Mil/uL (ref 4.22–5.81)
RDW: 13 % (ref 11.5–15.5)
WBC: 8.5 10*3/uL (ref 4.0–10.5)

## 2023-02-20 LAB — BASIC METABOLIC PANEL
BUN: 25 mg/dL — ABNORMAL HIGH (ref 6–23)
CO2: 30 mEq/L (ref 19–32)
Calcium: 9.9 mg/dL (ref 8.4–10.5)
Chloride: 103 mEq/L (ref 96–112)
Creatinine, Ser: 1.69 mg/dL — ABNORMAL HIGH (ref 0.40–1.50)
GFR: 41.38 mL/min — ABNORMAL LOW (ref >60.00)
Glucose, Bld: 163 mg/dL — ABNORMAL HIGH (ref 70–99)
Potassium: 4.4 mEq/L (ref 3.5–5.1)
Sodium: 140 mEq/L (ref 135–145)

## 2023-02-20 LAB — HEMOGLOBIN A1C: Hgb A1c MFr Bld: 7.1 % — ABNORMAL HIGH (ref 4.6–6.5)

## 2023-02-20 LAB — URIC ACID: Uric Acid, Serum: 5 mg/dL (ref 4.0–7.8)

## 2023-02-20 NOTE — Patient Instructions (Signed)
Go downstairs for labs before you leave.   Continue current medications.   I will be in touch with your results.   Check with the pharmacy regarding Shingrix vaccine.

## 2023-02-20 NOTE — Progress Notes (Unsigned)
Subjective:     Patient ID: Samuel Sanders, male    DOB: 11/11/1955, 67 y.o.   MRN: [id number]  Chief Complaint  Patient presents with   Medical Management of Chronic Issues    6 month f/u    HPI  Discussed the use of AI scribe software for clinical note transcription with the patient, who gave verbal consent to proceed.  History of Present Illness          Here to follow up on chronic health conditions.   DM- ozempic, metformin, glimepiride.   On statin and fenofibrate therapy   Taking allopurinol daily. No recent gout flares.   Sees nephrologist for CKD   Tdap 4 years ago.   Health Maintenance Due  Topic Date Due   FOOT EXAM  Never done   DTaP/Tdap/Td (1 - Tdap) Never done   Zoster Vaccines- Shingrix (1 of 2) Never done   Medicare Annual Wellness (AWV)  11/16/2021    Past Medical History:  Diagnosis Date   Essential hypertension    Hx of adenomatous colonic polyps 01/2020   Hyperlipidemia    Type 2 diabetes mellitus (HCC)     Past Surgical History:  Procedure Laterality Date   COLONOSCOPY  2009   normal   KNEE ARTHROSCOPY Left 1983   TENDON REPAIR Right 1990   wrist   TONSILLECTOMY  1958    Family History  Problem Relation Age of Onset   Cancer Mother    Healthy Father    Brain cancer Sister    Colon cancer Neg Hx    Colon polyps Neg Hx    Esophageal cancer Neg Hx    Rectal cancer Neg Hx    Stomach cancer Neg Hx     Social History   Socioeconomic History   Marital status: Married    Spouse name: Not on file   Number of children: Not on file   Years of education: Not on file   Highest education level: 12th grade  Occupational History   Not on file  Tobacco Use   Smoking status: Former    Types: Cigarettes    Quit date: 06/28/2004    Years since quitting: 18.6   Smokeless tobacco: Never  Vaping Use   Vaping Use: Never used  Substance and Sexual Activity   Alcohol use: Yes    Comment: rare beer monthly or so   Drug use: Not  Currently   Sexual activity: Yes  Other Topics Concern   Not on file  Social History Narrative   Not on file   Social Determinants of Health   Financial Resource Strain: Low Risk  (02/16/2023)   Overall Financial Resource Strain (CARDIA)    Difficulty of Paying Living Expenses: Not hard at all  Food Insecurity: No Food Insecurity (02/16/2023)   Hunger Vital Sign    Worried About Running Out of Food in the Last Year: Never true    Ran Out of Food in the Last Year: Never true  Transportation Needs: No Transportation Needs (02/16/2023)   PRAPARE - Administrator, Civil Service (Medical): No    Lack of Transportation (Non-Medical): No  Physical Activity: Sufficiently Active (02/16/2023)   Exercise Vital Sign    Days of Exercise per Week: 5 days    Minutes of Exercise per Session: 30 min  Stress: No Stress Concern Present (02/16/2023)   [employer] of Occupational Health - Occupational Stress Questionnaire    Feeling of Stress :  Only a little  Social Connections: Moderately Integrated (02/16/2023)   Social Connection and Isolation Panel [NHANES]    Frequency of Communication with Friends and Family: Once a week    Frequency of Social Gatherings with Friends and Family: Once a week    Attends Religious Services: 1 to 4 times per year    Active Member of [employer] or Organizations: Yes    Attends Banker Meetings: 1 to 4 times per year    Marital Status: Married  Catering manager Violence: Not on file    Outpatient Medications Prior to Visit  Medication Sig Dispense Refill   allopurinol (ZYLOPRIM) 100 MG tablet TAKE ONE TABLET BY MOUTH ONE TIME DAILY 90 tablet 0   amLODipine-benazepril (LOTREL) 5-20 MG capsule TAKE ONE CAPSULE BY MOUTH ONE TIME DAILY 90 capsule 0   anastrozole (ARIMIDEX) 1 MG tablet Take 1 mg by mouth daily.     aspirin EC 81 MG tablet Take 81 mg by mouth daily.     atorvastatin (LIPITOR) 20 MG tablet Take 1 tablet (20 mg total) by mouth  daily. Annual appt due in Dec must see provider for future refills 90 tablet 1   fenofibrate (TRICOR) 145 MG tablet TAKE ONE TABLET BY MOUTH ONE TIME DAILY 90 tablet 0   glimepiride (AMARYL) 2 MG tablet TAKE ONE TABLET BY MOUTH TWICE DAILY BEFORE A MEAL 180 tablet 0   metFORMIN (GLUCOPHAGE) 1000 MG tablet TAKE ONE-HALF TABLET BY MOUTH EVERY MORNING AND ONE AND ONE-HALF TABLETS IN THE EVENING 180 tablet 0   Omega-3 1000 MG CAPS Take by mouth.     Semaglutide, 1 MG/DOSE, (OZEMPIC, 1 MG/DOSE,) 4 MG/3ML SOPN INJECT 1 MG ONCE A WEEK AS DIRECTED 3 mL 3   testosterone cypionate (DEPOTESTOSTERONE CYPIONATE) 200 MG/ML injection Inject 200 mg into the muscle once a week.     No facility-administered medications prior to visit.    No Known Allergies  Review of Systems  Constitutional:  Negative for chills, fever, malaise/fatigue and weight loss.  Respiratory:  Negative for shortness of breath.   Cardiovascular:  Negative for chest pain, palpitations and leg swelling.  Gastrointestinal:  Negative for abdominal pain, constipation, diarrhea, nausea and vomiting.  Genitourinary:  Negative for dysuria, frequency and urgency.  Neurological:  Negative for dizziness.       Objective:    Physical Exam Constitutional:      General: He is not in acute distress.    Appearance: He is not ill-appearing.  Eyes:     Extraocular Movements: Extraocular movements intact.     Conjunctiva/sclera: Conjunctivae normal.  Cardiovascular:     Rate and Rhythm: Normal rate.  Pulmonary:     Effort: Pulmonary effort is normal.  Musculoskeletal:     Cervical back: Normal range of motion and neck supple.  Skin:    General: Skin is warm and dry.  Neurological:     General: No focal deficit present.     Mental Status: He is alert and oriented to person, place, and time.  Psychiatric:        Mood and Affect: Mood normal.        Behavior: Behavior normal.        Thought Content: Thought content normal.      BP  (!) 140/76 (BP Location: Left Arm, Patient Position: Sitting, Cuff Size: Large)   Pulse 68   Temp 97.8 F (36.6 C) (Temporal)   Ht 5\' 10"  (1.778 m)   Wt 219 lb (  99.3 kg)   SpO2 98%   BMI 31.42 kg/m  Wt Readings from Last 3 Encounters:  02/20/23 219 lb (99.3 kg)  08/21/22 224 lb (101.6 kg)  05/01/22 226 lb (102.5 kg)       Assessment & Plan:   Problem List Items Addressed This Visit       Cardiovascular and Mediastinum   Benign hypertension with CKD (chronic kidney disease) stage III (HCC)    Fairly well controlled at home, elevated here due to work stress. Monitor at home.  Continue current medications. Continue follow up with nephrologist.       Relevant Orders   Basic metabolic panel (Completed)   CBC with Differential/Platelet (Completed)     Endocrine   Type 2 diabetes mellitus with stage 3a chronic kidney disease, without long-term current use of insulin (HCC) - Primary    Continue current medications. Monitor BS at home. Low sugar, low carb diet.       Relevant Orders   Basic metabolic panel (Completed)   CBC with Differential/Platelet (Completed)   Hemoglobin A1c (Completed)     Other   Gout    Controlled. Continue allopurinol. Check uric acid level.       Relevant Orders   Uric acid (Completed)   Mixed hyperlipidemia    Controlled. Continue medications and low fat diet. Exercise. Recheck at next visit.        I am having Wilnette Kales maintain his testosterone cypionate, Omega-3, aspirin EC, anastrozole, allopurinol, amLODipine-benazepril, glimepiride, metFORMIN, fenofibrate, Ozempic (1 MG/DOSE), and atorvastatin.  No orders of the defined types were placed in this encounter.

## 2023-02-23 DIAGNOSIS — M109 Gout, unspecified: Secondary | ICD-10-CM | POA: Insufficient documentation

## 2023-02-23 NOTE — Assessment & Plan Note (Signed)
Fairly well controlled at home, elevated here due to work stress. Monitor at home.  Continue current medications. Continue follow up with nephrologist.

## 2023-02-23 NOTE — Assessment & Plan Note (Signed)
Controlled. Continue medications and low fat diet. Exercise. Recheck at next visit.

## 2023-02-23 NOTE — Assessment & Plan Note (Signed)
Controlled. Continue allopurinol. Check uric acid level.

## 2023-02-23 NOTE — Assessment & Plan Note (Signed)
Continue current medications. Monitor BS at home. Low sugar, low carb diet.

## 2023-03-03 ENCOUNTER — Other Ambulatory Visit: Payer: Self-pay | Admitting: Family Medicine

## 2023-03-03 DIAGNOSIS — N183 Chronic kidney disease, stage 3 unspecified: Secondary | ICD-10-CM

## 2023-03-03 DIAGNOSIS — E1122 Type 2 diabetes mellitus with diabetic chronic kidney disease: Secondary | ICD-10-CM

## 2023-03-03 DIAGNOSIS — N1831 Chronic kidney disease, stage 3a: Secondary | ICD-10-CM

## 2023-03-03 DIAGNOSIS — M109 Gout, unspecified: Secondary | ICD-10-CM

## 2023-03-03 DIAGNOSIS — I129 Hypertensive chronic kidney disease with stage 1 through stage 4 chronic kidney disease, or unspecified chronic kidney disease: Secondary | ICD-10-CM

## 2023-03-09 ENCOUNTER — Other Ambulatory Visit: Payer: Self-pay | Admitting: Family Medicine

## 2023-03-09 DIAGNOSIS — N1831 Chronic kidney disease, stage 3a: Secondary | ICD-10-CM

## 2023-04-08 LAB — HM DIABETES EYE EXAM

## 2023-05-14 ENCOUNTER — Other Ambulatory Visit: Payer: Self-pay | Admitting: Family Medicine

## 2023-05-14 DIAGNOSIS — N1831 Chronic kidney disease, stage 3a: Secondary | ICD-10-CM

## 2023-06-17 ENCOUNTER — Telehealth: Payer: Self-pay | Admitting: Family Medicine

## 2023-06-17 NOTE — Telephone Encounter (Signed)
Patient dropped off document Surgical Clearance, to be filled out by provider. Patient requested to send it back via Fax within 7-days. Document is located in providers tray at front office.Please advise at [facility] [phone]

## 2023-06-17 NOTE — Telephone Encounter (Signed)
Called and made appt for paperwork completion

## 2023-06-18 ENCOUNTER — Ambulatory Visit: Payer: Medicare Other | Admitting: Family Medicine

## 2023-06-18 ENCOUNTER — Encounter: Payer: Self-pay | Admitting: Family Medicine

## 2023-06-18 VITALS — BP 134/84 | HR 62 | Temp 97.6°F | Ht 70.0 in | Wt 220.0 lb

## 2023-06-18 DIAGNOSIS — Z01818 Encounter for other preprocedural examination: Secondary | ICD-10-CM

## 2023-06-18 DIAGNOSIS — N183 Chronic kidney disease, stage 3 unspecified: Secondary | ICD-10-CM

## 2023-06-18 DIAGNOSIS — E1122 Type 2 diabetes mellitus with diabetic chronic kidney disease: Secondary | ICD-10-CM | POA: Diagnosis not present

## 2023-06-18 DIAGNOSIS — I129 Hypertensive chronic kidney disease with stage 1 through stage 4 chronic kidney disease, or unspecified chronic kidney disease: Secondary | ICD-10-CM

## 2023-06-18 DIAGNOSIS — Z7984 Long term (current) use of oral hypoglycemic drugs: Secondary | ICD-10-CM

## 2023-06-18 DIAGNOSIS — E782 Mixed hyperlipidemia: Secondary | ICD-10-CM | POA: Diagnosis not present

## 2023-06-18 DIAGNOSIS — N1831 Chronic kidney disease, stage 3a: Secondary | ICD-10-CM | POA: Diagnosis not present

## 2023-06-18 LAB — COMPREHENSIVE METABOLIC PANEL
ALT: 43 U/L (ref 0–53)
AST: 31 U/L (ref 0–37)
Albumin: 4.8 g/dL (ref 3.5–5.2)
Alkaline Phosphatase: 57 U/L (ref 39–117)
BUN: 24 mg/dL — ABNORMAL HIGH (ref 6–23)
CO2: 30 meq/L (ref 19–32)
Calcium: 10.7 mg/dL — ABNORMAL HIGH (ref 8.4–10.5)
Chloride: 100 meq/L (ref 96–112)
Creatinine, Ser: 1.5 mg/dL (ref 0.40–1.50)
GFR: 47.64 mL/min — ABNORMAL LOW (ref >60.00)
Glucose, Bld: 79 mg/dL (ref 70–99)
Potassium: 4 meq/L (ref 3.5–5.1)
Sodium: 140 meq/L (ref 135–145)
Total Bilirubin: 0.6 mg/dL (ref 0.2–1.2)
Total Protein: 7.5 g/dL (ref 6.0–8.3)

## 2023-06-18 LAB — CBC WITH DIFFERENTIAL/PLATELET
Basophils Absolute: 0.1 10*3/uL (ref 0.0–0.1)
Basophils Relative: 0.7 % (ref 0.0–3.0)
Eosinophils Absolute: 0.3 10*3/uL (ref 0.0–0.7)
Eosinophils Relative: 3.3 % (ref 0.0–5.0)
HCT: 49.8 % (ref 39.0–52.0)
Hemoglobin: 16.4 g/dL (ref 13.0–17.0)
Lymphocytes Relative: 29.5 % (ref 12.0–46.0)
Lymphs Abs: 3 10*3/uL (ref 0.7–4.0)
MCHC: 33 g/dL (ref 30.0–36.0)
MCV: 87.2 fL (ref 78.0–100.0)
Monocytes Absolute: 0.7 10*3/uL (ref 0.1–1.0)
Monocytes Relative: 6.7 % (ref 3.0–12.0)
Neutro Abs: 6.1 10*3/uL (ref 1.4–7.7)
Neutrophils Relative %: 59.8 % (ref 43.0–77.0)
Platelets: 269 10*3/uL (ref 150.0–400.0)
RBC: 5.72 Mil/uL (ref 4.22–5.81)
RDW: 13.3 % (ref 11.5–15.5)
WBC: 10.2 10*3/uL (ref 4.0–10.5)

## 2023-06-18 LAB — HEMOGLOBIN A1C: Hgb A1c MFr Bld: 7.5 % — ABNORMAL HIGH (ref 4.6–6.5)

## 2023-06-18 NOTE — Assessment & Plan Note (Signed)
Fairly well controlled. Continue monitoring.   Continue current medications. Continue follow up with nephrologist.

## 2023-06-18 NOTE — Progress Notes (Signed)
Subjective:     Patient ID: Samuel Sanders, male    DOB: 11/28/1955, 67 y.o.   MRN: [id number]  Chief Complaint  Patient presents with   surgical clearance     HPI   History of Present Illness         Pre-operative clearance visit. Planing to have dental extractions and other dental procedures at [facility].  Is not scheduled. Hoping to get it done in November.  Denies any issues with anesthesia in the past.    DM-last A1c 7.1% in June 2024. States he has not been checking his blood sugars.  States his diet has not been good recently. Good compliance with medications.  Reports taking 81 mg aspirin daily.  Sees nephrology for CKD    Health Maintenance Due  Topic Date Due   FOOT EXAM  Never done   DTaP/Tdap/Td (1 - Tdap) Never done   Medicare Annual Wellness (AWV)  11/16/2021    Past Medical History:  Diagnosis Date   Essential hypertension    Hx of adenomatous colonic polyps 01/2020   Hyperlipidemia    Type 2 diabetes mellitus (HCC)     Past Surgical History:  Procedure Laterality Date   COLONOSCOPY  2009   normal   KNEE ARTHROSCOPY Left 1983   TENDON REPAIR Right 1990   wrist   TONSILLECTOMY  1958    Family History  Problem Relation Age of Onset   Cancer Mother    Healthy Father    Brain cancer Sister    Colon cancer Neg Hx    Colon polyps Neg Hx    Esophageal cancer Neg Hx    Rectal cancer Neg Hx    Stomach cancer Neg Hx     Social History   Socioeconomic History   Marital status: Married    Spouse name: Not on file   Number of children: Not on file   Years of education: Not on file   Highest education level: 12th grade  Occupational History   Not on file  Tobacco Use   Smoking status: Former    Current packs/day: 0.00    Types: Cigarettes    Quit date: 06/28/2004    Years since quitting: 18.9   Smokeless tobacco: Never  Vaping Use   Vaping status: Never Used  Substance and Sexual Activity   Alcohol use: Yes    Comment:  rare beer monthly or so   Drug use: Not Currently   Sexual activity: Yes  Other Topics Concern   Not on file  Social History Narrative   Not on file   Social Determinants of Health   Financial Resource Strain: Low Risk  (02/16/2023)   Overall Financial Resource Strain (CARDIA)    Difficulty of Paying Living Expenses: Not hard at all  Food Insecurity: No Food Insecurity (02/16/2023)   Hunger Vital Sign    Worried About Running Out of Food in the Last Year: Never true    Ran Out of Food in the Last Year: Never true  Transportation Needs: No Transportation Needs (02/16/2023)   PRAPARE - Administrator, Civil Service (Medical): No    Lack of Transportation (Non-Medical): No  Physical Activity: Sufficiently Active (02/16/2023)   Exercise Vital Sign    Days of Exercise per Week: 5 days    Minutes of Exercise per Session: 30 min  Stress: No Stress Concern Present (02/16/2023)   [employer] of Occupational Health - Occupational Stress Questionnaire  Feeling of Stress : Only a little  Social Connections: Moderately Integrated (02/16/2023)   Social Connection and Isolation Panel [NHANES]    Frequency of Communication with Friends and Family: Once a week    Frequency of Social Gatherings with Friends and Family: Once a week    Attends Religious Services: 1 to 4 times per year    Active Member of [employer] or Organizations: Yes    Attends Banker Meetings: 1 to 4 times per year    Marital Status: Married  Catering manager Violence: Not on file    Outpatient Medications Prior to Visit  Medication Sig Dispense Refill   allopurinol (ZYLOPRIM) 100 MG tablet TAKE ONE TABLET BY MOUTH ONCE A DAY 90 tablet 1   amLODipine-benazepril (LOTREL) 5-20 MG capsule TAKE ONE CAPSULE BY MOUTH ONCE A DAY 90 capsule 1   anastrozole (ARIMIDEX) 1 MG tablet Take 1 mg by mouth daily.     aspirin EC 81 MG tablet Take 81 mg by mouth daily.     atorvastatin (LIPITOR) 20 MG tablet Take 1  tablet (20 mg total) by mouth daily. Annual appt due in Dec must see provider for future refills 90 tablet 1   fenofibrate (TRICOR) 145 MG tablet TAKE ONE TABLET BY MOUTH ONCE A DAY 90 tablet 1   glimepiride (AMARYL) 2 MG tablet TAKE ONE TABLET BY MOUTH TWICE DAILY BEFORE A MEAL 180 tablet 1   metFORMIN (GLUCOPHAGE) 1000 MG tablet TAKE ONE-HALF TABLET BY MOUTH EVERY MORNING AND ONE AND ONE-HALF TABLETS IN THE EVENING 180 tablet 1   Omega-3 1000 MG CAPS Take by mouth.     Semaglutide, 1 MG/DOSE, (OZEMPIC, 1 MG/DOSE,) 4 MG/3ML SOPN INJECT 1 MG ONCE A WEEK AS DIRECTED 3 mL 3   testosterone cypionate (DEPOTESTOSTERONE CYPIONATE) 200 MG/ML injection Inject 200 mg into the muscle once a week.     No facility-administered medications prior to visit.    No Known Allergies  Review of Systems  Constitutional:  Negative for chills, fever, malaise/fatigue and weight loss.  Respiratory:  Negative for cough and shortness of breath.   Cardiovascular:  Negative for chest pain, palpitations and leg swelling.  Gastrointestinal:  Negative for abdominal pain, constipation, diarrhea, nausea and vomiting.  Genitourinary:  Negative for dysuria, frequency and urgency.  Neurological:  Negative for dizziness, focal weakness and headaches.  Endo/Heme/Allergies:  Negative for polydipsia.       Objective:    Physical Exam Constitutional:      General: He is not in acute distress.    Appearance: He is not ill-appearing.  HENT:     Mouth/Throat:     Mouth: Mucous membranes are moist.     Pharynx: Oropharynx is clear.  Eyes:     Extraocular Movements: Extraocular movements intact.     Conjunctiva/sclera: Conjunctivae normal.  Cardiovascular:     Rate and Rhythm: Normal rate and regular rhythm.  Pulmonary:     Effort: Pulmonary effort is normal.     Breath sounds: Normal breath sounds.  Musculoskeletal:        General: Normal range of motion.     Cervical back: Normal range of motion and neck supple. No  tenderness.     Right lower leg: No edema.     Left lower leg: No edema.  Lymphadenopathy:     Cervical: No cervical adenopathy.  Skin:    General: Skin is warm and dry.  Neurological:     General: No focal deficit present.  Mental Status: He is alert and oriented to person, place, and time.     Cranial Nerves: No cranial nerve deficit.     Motor: No weakness.     Coordination: Coordination normal.     Gait: Gait normal.  Psychiatric:        Mood and Affect: Mood normal.        Behavior: Behavior normal.        Thought Content: Thought content normal.      BP 134/84 (BP Location: Left Arm, Patient Position: Sitting, Cuff Size: Large)   Pulse 62   Temp 97.6 F (36.4 C) (Temporal)   Ht 5\' 10"  (1.778 m)   Wt 220 lb (99.8 kg)   SpO2 98%   BMI 31.57 kg/m  Wt Readings from Last 3 Encounters:  06/18/23 220 lb (99.8 kg)  02/20/23 219 lb (99.3 kg)  08/21/22 224 lb (101.6 kg)       Assessment & Plan:   Problem List Items Addressed This Visit       Cardiovascular and Mediastinum   Benign hypertension with CKD (chronic kidney disease) stage III (HCC)    Fairly well controlled. Continue monitoring.   Continue current medications. Continue follow up with nephrologist.         Endocrine   Type 2 diabetes mellitus with stage 3a chronic kidney disease, without long-term current use of insulin (HCC)    Continue current medications. Monitor BS at home. Low sugar, low carb diet. Check A1c and follow up      Relevant Orders   CBC with Differential/Platelet (Completed)   Comprehensive metabolic panel (Completed)   Hemoglobin A1c (Completed)     Other   Mixed hyperlipidemia    Controlled. Continue medications and low fat diet. Exercise. Recheck at next visit.       Pre-operative clearance - Primary    Check labs and access DM in order to clear him for oral surgery.       Relevant Orders   CBC with Differential/Platelet (Completed)   Comprehensive metabolic panel  (Completed)   Hemoglobin A1c (Completed)    I am having Wilnette Kales maintain his testosterone cypionate, Omega-3, aspirin EC, anastrozole, atorvastatin, amLODipine-benazepril, allopurinol, glimepiride, fenofibrate, metFORMIN, and Ozempic (1 MG/DOSE).  No orders of the defined types were placed in this encounter.

## 2023-06-18 NOTE — Assessment & Plan Note (Signed)
Continue current medications. Monitor BS at home. Low sugar, low carb diet. Check A1c and follow up

## 2023-06-18 NOTE — Progress Notes (Signed)
Please call and ask him to hydrate and come back in next week for a recheck of his calcium level. It is just mildly elevated. His A1c is 7.5%. I recommend cutting back on sweets, carbohydrates and processed foods prior to his surgery.

## 2023-06-18 NOTE — Patient Instructions (Signed)
Please go downstairs for labs.

## 2023-06-18 NOTE — Assessment & Plan Note (Signed)
Check labs and access DM in order to clear him for oral surgery.

## 2023-06-18 NOTE — Assessment & Plan Note (Signed)
Controlled. Continue medications and low fat diet. Exercise. Recheck at next visit.

## 2023-06-25 ENCOUNTER — Encounter: Payer: Self-pay | Admitting: Family Medicine

## 2023-06-25 ENCOUNTER — Ambulatory Visit (INDEPENDENT_AMBULATORY_CARE_PROVIDER_SITE_OTHER): Payer: Medicare Other | Admitting: Family Medicine

## 2023-06-25 DIAGNOSIS — N183 Chronic kidney disease, stage 3 unspecified: Secondary | ICD-10-CM

## 2023-06-25 DIAGNOSIS — I129 Hypertensive chronic kidney disease with stage 1 through stage 4 chronic kidney disease, or unspecified chronic kidney disease: Secondary | ICD-10-CM

## 2023-06-25 LAB — BASIC METABOLIC PANEL
BUN: 34 mg/dL — ABNORMAL HIGH (ref 6–23)
CO2: 29 meq/L (ref 19–32)
Calcium: 10.4 mg/dL (ref 8.4–10.5)
Chloride: 102 meq/L (ref 96–112)
Creatinine, Ser: 1.71 mg/dL — ABNORMAL HIGH (ref 0.40–1.50)
GFR: 40.7 mL/min — ABNORMAL LOW (ref >60.00)
Glucose, Bld: 114 mg/dL — ABNORMAL HIGH (ref 70–99)
Potassium: 4.1 meq/L (ref 3.5–5.1)
Sodium: 138 meq/L (ref 135–145)

## 2023-06-25 NOTE — Progress Notes (Signed)
Subjective:     Patient ID: Samuel Sanders, male    DOB: 08-30-1956, 67 y.o.   MRN: [id number]  Chief Complaint  Patient presents with   Follow-up    Calcium blood levels    HPI  History of Present Illness         Here to follow up on elevated serum calcium level.  Denies taking supplements. He does drink protein shakes.   Upcoming oral surgery   Sees nephrology for CKD    Health Maintenance Due  Topic Date Due   FOOT EXAM  Never done   DTaP/Tdap/Td (1 - Tdap) Never done   Medicare Annual Wellness (AWV)  11/16/2021    Past Medical History:  Diagnosis Date   Essential hypertension    Hx of adenomatous colonic polyps 01/2020   Hyperlipidemia    Type 2 diabetes mellitus (HCC)     Past Surgical History:  Procedure Laterality Date   COLONOSCOPY  2009   normal   KNEE ARTHROSCOPY Left 1983   TENDON REPAIR Right 1990   wrist   TONSILLECTOMY  1958    Family History  Problem Relation Age of Onset   Cancer Mother    Healthy Father    Brain cancer Sister    Colon cancer Neg Hx    Colon polyps Neg Hx    Esophageal cancer Neg Hx    Rectal cancer Neg Hx    Stomach cancer Neg Hx     Social History   Socioeconomic History   Marital status: Married    Spouse name: Not on file   Number of children: Not on file   Years of education: Not on file   Highest education level: 12th grade  Occupational History   Not on file  Tobacco Use   Smoking status: Former    Current packs/day: 0.00    Types: Cigarettes    Quit date: 06/28/2004    Years since quitting: 19.0   Smokeless tobacco: Never  Vaping Use   Vaping status: Never Used  Substance and Sexual Activity   Alcohol use: Yes    Comment: rare beer monthly or so   Drug use: Not Currently   Sexual activity: Yes  Other Topics Concern   Not on file  Social History Narrative   Not on file   Social Determinants of Health   Financial Resource Strain: Low Risk  (06/24/2023)   Overall Financial Resource  Strain (CARDIA)    Difficulty of Paying Living Expenses: Not hard at all  Food Insecurity: No Food Insecurity (06/24/2023)   Hunger Vital Sign    Worried About Running Out of Food in the Last Year: Never true    Ran Out of Food in the Last Year: Never true  Transportation Needs: No Transportation Needs (06/24/2023)   PRAPARE - Administrator, Civil Service (Medical): No    Lack of Transportation (Non-Medical): No  Physical Activity: Insufficiently Active (06/24/2023)   Exercise Vital Sign    Days of Exercise per Week: 3 days    Minutes of Exercise per Session: 40 min  Stress: No Stress Concern Present (06/24/2023)   [employer] of Occupational Health - Occupational Stress Questionnaire    Feeling of Stress : Only a little  Social Connections: Unknown (06/24/2023)   Social Connection and Isolation Panel [NHANES]    Frequency of Communication with Friends and Family: Once a week    Frequency of Social Gatherings with Friends and Family: Once a  week    Attends Religious Services: Patient declined    Active Member of Clubs or Organizations: Yes    Attends Banker Meetings: 1 to 4 times per year    Marital Status: Married  Catering manager Violence: Not on file    Outpatient Medications Prior to Visit  Medication Sig Dispense Refill   allopurinol (ZYLOPRIM) 100 MG tablet TAKE ONE TABLET BY MOUTH ONCE A DAY 90 tablet 1   amLODipine-benazepril (LOTREL) 5-20 MG capsule TAKE ONE CAPSULE BY MOUTH ONCE A DAY 90 capsule 1   anastrozole (ARIMIDEX) 1 MG tablet Take 1 mg by mouth daily.     aspirin EC 81 MG tablet Take 81 mg by mouth daily.     atorvastatin (LIPITOR) 20 MG tablet Take 1 tablet (20 mg total) by mouth daily. Annual appt due in Dec must see provider for future refills 90 tablet 1   fenofibrate (TRICOR) 145 MG tablet TAKE ONE TABLET BY MOUTH ONCE A DAY 90 tablet 1   glimepiride (AMARYL) 2 MG tablet TAKE ONE TABLET BY MOUTH TWICE DAILY BEFORE A MEAL 180  tablet 1   metFORMIN (GLUCOPHAGE) 1000 MG tablet TAKE ONE-HALF TABLET BY MOUTH EVERY MORNING AND ONE AND ONE-HALF TABLETS IN THE EVENING 180 tablet 1   Omega-3 1000 MG CAPS Take by mouth.     Semaglutide, 1 MG/DOSE, (OZEMPIC, 1 MG/DOSE,) 4 MG/3ML SOPN INJECT 1 MG ONCE A WEEK AS DIRECTED 3 mL 3   testosterone cypionate (DEPOTESTOSTERONE CYPIONATE) 200 MG/ML injection Inject 200 mg into the muscle once a week.     No facility-administered medications prior to visit.    No Known Allergies  Review of Systems  Constitutional:  Negative for chills and fever.  Respiratory:  Negative for shortness of breath.   Cardiovascular:  Negative for chest pain, palpitations and leg swelling.  Gastrointestinal:  Negative for abdominal pain, constipation, diarrhea, nausea and vomiting.  Genitourinary:  Negative for dysuria, frequency and urgency.  Musculoskeletal:  Negative for myalgias.  Neurological:  Negative for dizziness and focal weakness.       Objective:    Physical Exam Constitutional:      General: He is not in acute distress.    Appearance: He is not ill-appearing.  Eyes:     Extraocular Movements: Extraocular movements intact.     Conjunctiva/sclera: Conjunctivae normal.  Cardiovascular:     Rate and Rhythm: Normal rate.  Pulmonary:     Effort: Pulmonary effort is normal.  Musculoskeletal:     Cervical back: Normal range of motion and neck supple.  Skin:    General: Skin is warm and dry.  Neurological:     General: No focal deficit present.     Mental Status: He is alert and oriented to person, place, and time.  Psychiatric:        Mood and Affect: Mood normal.        Behavior: Behavior normal.        Thought Content: Thought content normal.      BP 132/78   Pulse 65   Temp 98.1 F (36.7 C) (Oral)   Ht 5\' 10"  (1.778 m)   Wt 218 lb 9.6 oz (99.2 kg)   SpO2 97%   BMI 31.37 kg/m  Wt Readings from Last 3 Encounters:  06/25/23 218 lb 9.6 oz (99.2 kg)  06/18/23 220 lb  (99.8 kg)  02/20/23 219 lb (99.3 kg)       Assessment & Plan:   Problem  List Items Addressed This Visit       Cardiovascular and Mediastinum   Benign hypertension with CKD (chronic kidney disease) stage III (HCC)   Relevant Orders   PTH, intact and calcium   Basic metabolic panel (Completed)   Other Visit Diagnoses     Hypercalcemia    -  Primary   Relevant Orders   PTH, intact and calcium   Basic metabolic panel (Completed)      Recheck serum calcium and PTH. Continue follow up with nephrology.  He will need clearance for oral surgery  I am having Wilnette Kales maintain his testosterone cypionate, Omega-3, aspirin EC, anastrozole, atorvastatin, amLODipine-benazepril, allopurinol, glimepiride, fenofibrate, metFORMIN, and Ozempic (1 MG/DOSE).  No orders of the defined types were placed in this encounter.

## 2023-06-26 ENCOUNTER — Encounter: Payer: Self-pay | Admitting: Family Medicine

## 2023-06-26 LAB — PTH, INTACT AND CALCIUM
Calcium: 10.7 mg/dL — ABNORMAL HIGH (ref 8.6–10.3)
PTH: 20 pg/mL (ref 16–77)

## 2023-06-26 NOTE — Telephone Encounter (Signed)
Please advise, I do not see thyroid test?

## 2023-06-26 NOTE — Progress Notes (Signed)
Ok to send surgical clearance form. I sent patient a message as well.

## 2023-08-11 ENCOUNTER — Other Ambulatory Visit: Payer: Self-pay | Admitting: Family Medicine

## 2023-08-11 DIAGNOSIS — E1122 Type 2 diabetes mellitus with diabetic chronic kidney disease: Secondary | ICD-10-CM

## 2023-08-18 ENCOUNTER — Other Ambulatory Visit: Payer: Self-pay | Admitting: Family Medicine

## 2023-08-18 DIAGNOSIS — I129 Hypertensive chronic kidney disease with stage 1 through stage 4 chronic kidney disease, or unspecified chronic kidney disease: Secondary | ICD-10-CM

## 2023-08-18 DIAGNOSIS — M109 Gout, unspecified: Secondary | ICD-10-CM

## 2023-08-18 DIAGNOSIS — N1831 Chronic kidney disease, stage 3a: Secondary | ICD-10-CM

## 2023-08-26 ENCOUNTER — Other Ambulatory Visit: Payer: Self-pay | Admitting: Family Medicine

## 2023-08-26 DIAGNOSIS — N1831 Chronic kidney disease, stage 3a: Secondary | ICD-10-CM

## 2023-08-30 ENCOUNTER — Other Ambulatory Visit: Payer: Self-pay | Admitting: Family Medicine

## 2023-08-30 DIAGNOSIS — N1831 Chronic kidney disease, stage 3a: Secondary | ICD-10-CM

## 2023-09-01 ENCOUNTER — Ambulatory Visit (INDEPENDENT_AMBULATORY_CARE_PROVIDER_SITE_OTHER): Payer: Medicare Other | Admitting: Family Medicine

## 2023-09-01 ENCOUNTER — Encounter: Payer: Self-pay | Admitting: Family Medicine

## 2023-09-01 VITALS — BP 130/84 | HR 63 | Temp 97.7°F | Ht 70.0 in | Wt 219.0 lb

## 2023-09-01 DIAGNOSIS — E782 Mixed hyperlipidemia: Secondary | ICD-10-CM

## 2023-09-01 DIAGNOSIS — N1832 Chronic kidney disease, stage 3b: Secondary | ICD-10-CM

## 2023-09-01 DIAGNOSIS — Z0001 Encounter for general adult medical examination with abnormal findings: Secondary | ICD-10-CM | POA: Insufficient documentation

## 2023-09-01 DIAGNOSIS — N1831 Type 2 diabetes mellitus with diabetic chronic kidney disease: Secondary | ICD-10-CM

## 2023-09-01 DIAGNOSIS — E1122 Type 2 diabetes mellitus with diabetic chronic kidney disease: Secondary | ICD-10-CM

## 2023-09-01 DIAGNOSIS — Z0000 Encounter for general adult medical examination without abnormal findings: Secondary | ICD-10-CM

## 2023-09-01 DIAGNOSIS — Z9189 Other specified personal risk factors, not elsewhere classified: Secondary | ICD-10-CM | POA: Insufficient documentation

## 2023-09-01 LAB — COMPREHENSIVE METABOLIC PANEL
ALT: 33 U/L (ref 0–53)
AST: 25 U/L (ref 0–37)
Albumin: 4.7 g/dL (ref 3.5–5.2)
Alkaline Phosphatase: 52 U/L (ref 39–117)
BUN: 27 mg/dL — ABNORMAL HIGH (ref 6–23)
CO2: 29 meq/L (ref 19–32)
Calcium: 10 mg/dL (ref 8.4–10.5)
Chloride: 102 meq/L (ref 96–112)
Creatinine, Ser: 1.53 mg/dL — ABNORMAL HIGH (ref 0.40–1.50)
GFR: 46.45 mL/min — ABNORMAL LOW (ref >60.00)
Glucose, Bld: 121 mg/dL — ABNORMAL HIGH (ref 70–99)
Potassium: 4.2 meq/L (ref 3.5–5.1)
Sodium: 140 meq/L (ref 135–145)
Total Bilirubin: 0.6 mg/dL (ref 0.2–1.2)
Total Protein: 7.2 g/dL (ref 6.0–8.3)

## 2023-09-01 LAB — LIPID PANEL
Cholesterol: 146 mg/dL (ref 0–200)
HDL: 33.7 mg/dL — ABNORMAL LOW (ref >39.00)
LDL Cholesterol: 69 mg/dL (ref 0–99)
NonHDL: 111.84
Total CHOL/HDL Ratio: 4
Triglycerides: 212 mg/dL — ABNORMAL HIGH (ref 0.0–149.0)
VLDL: 42.4 mg/dL — ABNORMAL HIGH (ref 0.0–40.0)

## 2023-09-01 LAB — PHOSPHORUS: Phosphorus: 3 mg/dL (ref 2.3–4.6)

## 2023-09-01 LAB — CBC WITH DIFFERENTIAL/PLATELET
Basophils Absolute: 0.1 10*3/uL (ref 0.0–0.1)
Basophils Relative: 0.6 % (ref 0.0–3.0)
Eosinophils Absolute: 0.3 10*3/uL (ref 0.0–0.7)
Eosinophils Relative: 3.4 % (ref 0.0–5.0)
HCT: 46.5 % (ref 39.0–52.0)
Hemoglobin: 16 g/dL (ref 13.0–17.0)
Lymphocytes Relative: 27.7 % (ref 12.0–46.0)
Lymphs Abs: 2.6 10*3/uL (ref 0.7–4.0)
MCHC: 34.4 g/dL (ref 30.0–36.0)
MCV: 86.7 fL (ref 78.0–100.0)
Monocytes Absolute: 0.6 10*3/uL (ref 0.1–1.0)
Monocytes Relative: 6.1 % (ref 3.0–12.0)
Neutro Abs: 5.7 10*3/uL (ref 1.4–7.7)
Neutrophils Relative %: 62.2 % (ref 43.0–77.0)
Platelets: 253 10*3/uL (ref 150.0–400.0)
RBC: 5.36 Mil/uL (ref 4.22–5.81)
RDW: 13 % (ref 11.5–15.5)
WBC: 9.2 10*3/uL (ref 4.0–10.5)

## 2023-09-01 LAB — VITAMIN D 25 HYDROXY (VIT D DEFICIENCY, FRACTURES): VITD: 72.15 ng/mL (ref 30.00–100.00)

## 2023-09-01 MED ORDER — DEXCOM G7 SENSOR MISC: 5 refills | Status: DC

## 2023-09-01 NOTE — Assessment & Plan Note (Signed)
Controlled. Continue medications and low fat diet. Exercise. Check fasting lipids.

## 2023-09-01 NOTE — Assessment & Plan Note (Addendum)
Managed by nephrologist. Recommend good control of HTN, DM and HLD

## 2023-09-01 NOTE — Assessment & Plan Note (Signed)
Recheck today along with phosphorous. Normal PTH

## 2023-09-01 NOTE — Assessment & Plan Note (Signed)
Continue current medications. Monitor BS at home. Low sugar, low carb diet. Check A1c and follow up

## 2023-09-01 NOTE — Progress Notes (Signed)
 Complete physical exam  Patient: Samuel Sanders   DOB: 03/16/1956   67 y.o. Male  MRN: [id number]  Subjective:    Chief Complaint  Patient presents with   Annual Exam    fasting   He is here for a complete physical exam.  Other providers:  [facility]  Nephrologist- Dr. Carlette   Taking testosterone  0.5 mg weekly  States he had labs done recently  PSA was normal last month   Taking vitamin D  supplement   Calcium  previously elevated with normal PTH  Sees nephrologist for CKD  He has an appt in early January    Having oral surgery January 30th   DM- interested in CGM  Reports good compliance with medications  Diet could be better Active job but no exercise outside of walking at work   HLD- on Lipitor and Tricor     Health Maintenance  Topic Date Due   Medicare Annual Wellness Visit  11/16/2021   Yearly kidney health urinalysis for diabetes  08/22/2023   COVID-19 Vaccine (3 - 2024-25 season) 09/17/2023*   Zoster (Shingles) Vaccine (1 of 2) 09/18/2023*   Flu Shot  11/30/2023*   Hemoglobin A1C  12/17/2023   Eye exam for diabetics  04/07/2024   Yearly kidney function blood test for diabetes  08/31/2024   Complete foot exam   08/31/2024   Colon Cancer Screening  02/02/2025   DTaP/Tdap/Td vaccine (2 - Tdap) 09/08/2027   Pneumonia Vaccine  Completed   Hepatitis C Screening  Completed   HPV Vaccine  Aged Out  *Topic was postponed. The date shown is not the original due date.    Wears seatbelt always, uses sunscreen, smoke detectors in home and functioning, does not text while driving, feels safe in home environment.  Depression screening:    09/01/2023    8:14 AM 06/18/2023   11:01 AM 02/20/2023    2:11 PM  Depression screen PHQ 2/9  Decreased Interest 0 0 0  Down, Depressed, Hopeless 0 0 0  PHQ - 2 Score 0 0 0   Anxiety Screening:     No data to display          Vision:Within last year, Dental: No current dental problems and Receives regular  dental care, and PSA: Prostate cancer screening and PSA options (with potential risks and benefits of testing vs. not testing) were discussed along with recent recs/guidelines.  PSA done with urologist   Patient Active Problem List   Diagnosis Date Noted   Hypercalcemia 09/01/2023   Encounter for general adult medical examination with abnormal findings 09/01/2023   History of moderate sun exposure 09/01/2023   Pre-operative clearance 06/18/2023   Gout 02/23/2023   History of gout 05/01/2022   Mixed hyperlipidemia 05/01/2022   Obesity (BMI 30-39.9) 11/14/2020   Lumbar radiculopathy 04/17/2020   Stage 3b chronic kidney disease (HCC) 11/14/2019   Dupuytren's disease of palm 09/27/2018   Pain in finger of left hand 09/27/2018   Benign hypertension with CKD (chronic kidney disease) stage III (HCC) 12/11/2015   Type 2 diabetes mellitus with stage 3a chronic kidney disease, without long-term current use of insulin (HCC) 12/11/2015   Past Medical History:  Diagnosis Date   Essential hypertension    Hx of adenomatous colonic polyps 01/2020   Hyperlipidemia    Type 2 diabetes mellitus (HCC)    Past Surgical History:  Procedure Laterality Date   COLONOSCOPY  2009   normal   KNEE ARTHROSCOPY Left 1983  TENDON REPAIR Right 1990   wrist   TONSILLECTOMY  1958   Social History   Tobacco Use   Smoking status: Former    Current packs/day: 0.00    Average packs/day: 1 pack/day for 15.0 years (15.0 ttl pk-yrs)    Types: Cigarettes    Quit date: 06/28/2004    Years since quitting: 19.1   Smokeless tobacco: Never  Vaping Use   Vaping status: Never Used  Substance Use Topics   Alcohol use: Not Currently    Comment: rare beer monthly or so   Drug use: Never      Patient Care Team: Lendia Boby CROME, NP-C as PCP - General (Family Medicine)   Outpatient Medications Prior to Visit  Medication Sig   allopurinol  (ZYLOPRIM ) 100 MG tablet TAKE ONE TABLET BY MOUTH ONCE A DAY    amLODipine -benazepril  (LOTREL) 5-20 MG capsule TAKE ONE CAPSULE BY MOUTH ONCE A DAY   anastrozole (ARIMIDEX) 1 MG tablet Take 1 mg by mouth daily.   aspirin EC 81 MG tablet Take 81 mg by mouth daily.   atorvastatin  (LIPITOR) 20 MG tablet TAKE ONE TABLET BY MOUTH ONCE A DAY   fenofibrate  (TRICOR ) 145 MG tablet TAKE ONE TABLET BY MOUTH ONCE A DAY   glimepiride  (AMARYL ) 2 MG tablet take 1 tablet by mouth twice a day before a meal   metFORMIN  (GLUCOPHAGE ) 1000 MG tablet TAKE ONE-HALF TABLET BY MOUTH DAILY IN THE MORNING AND ONE AND ONE-HALF TABLETS IN THE EVENING   Omega-3 1000 MG CAPS Take by mouth.   Semaglutide , 1 MG/DOSE, (OZEMPIC , 1 MG/DOSE,) 4 MG/3ML SOPN INJECT 1 MG ONCE A WEEK AS DIRECTED   testosterone  cypionate (DEPOTESTOSTERONE CYPIONATE) 200 MG/ML injection Inject 200 mg into the muscle once a week.   No facility-administered medications prior to visit.    Review of Systems  Constitutional:  Negative for chills, fever and weight loss.  HENT:  Negative for congestion, ear pain, sinus pain and sore throat.   Eyes:  Negative for blurred vision, double vision and pain.  Respiratory:  Negative for cough, shortness of breath and wheezing.   Cardiovascular:  Negative for chest pain, palpitations and leg swelling.  Gastrointestinal:  Negative for abdominal pain, constipation, diarrhea, nausea and vomiting.  Genitourinary:  Negative for dysuria, frequency and urgency.  Musculoskeletal:  Positive for joint pain. Negative for back pain and myalgias.  Skin:  Negative for rash.  Neurological:  Negative for dizziness, tingling, focal weakness and headaches.  Endo/Heme/Allergies:  Does not bruise/bleed easily.  Psychiatric/Behavioral:  Negative for depression. The patient is not nervous/anxious.        Objective:    BP 130/84 (BP Location: Left Arm, Patient Position: Sitting, Cuff Size: Large)   Pulse 63   Temp 97.7 F (36.5 C) (Temporal)   Ht 5' 10 (1.778 m)   Wt 219 lb (99.3 kg)    SpO2 98%   BMI 31.42 kg/m  BP Readings from Last 3 Encounters:  09/01/23 130/84  06/25/23 132/78  06/18/23 134/84   Wt Readings from Last 3 Encounters:  09/01/23 219 lb (99.3 kg)  06/25/23 218 lb 9.6 oz (99.2 kg)  06/18/23 220 lb (99.8 kg)    Physical Exam Constitutional:      General: He is not in acute distress.    Appearance: He is not ill-appearing.  HENT:     Right Ear: Tympanic membrane, ear canal and external ear normal.     Left Ear: Tympanic membrane, ear canal and  external ear normal.     Nose: Nose normal.     Mouth/Throat:     Mouth: Mucous membranes are moist.     Pharynx: Oropharynx is clear.  Eyes:     Extraocular Movements: Extraocular movements intact.     Conjunctiva/sclera: Conjunctivae normal.     Pupils: Pupils are equal, round, and reactive to light.  Neck:     Thyroid : No thyroid  mass, thyromegaly or thyroid  tenderness.  Cardiovascular:     Rate and Rhythm: Normal rate and regular rhythm.     Pulses: Normal pulses.     Heart sounds: Normal heart sounds.  Pulmonary:     Effort: Pulmonary effort is normal.     Breath sounds: Normal breath sounds.  Abdominal:     General: Bowel sounds are normal. There is no distension.     Palpations: Abdomen is soft.     Tenderness: There is no abdominal tenderness. There is no right CVA tenderness, left CVA tenderness, guarding or rebound.  Musculoskeletal:        General: Normal range of motion.     Cervical back: Normal range of motion and neck supple. No tenderness.     Right lower leg: No edema.     Left lower leg: No edema.  Lymphadenopathy:     Cervical: No cervical adenopathy.  Skin:    General: Skin is warm and dry.     Findings: No lesion or rash.     Comments: Right ear nevus  Neurological:     General: No focal deficit present.     Mental Status: He is alert and oriented to person, place, and time.     Cranial Nerves: No cranial nerve deficit.     Sensory: No sensory deficit.     Motor: No  weakness.  Psychiatric:        Mood and Affect: Mood normal.        Behavior: Behavior normal.        Thought Content: Thought content normal.      Results for orders placed or performed in visit on 09/01/23  VITAMIN D  25 Hydroxy (Vit-D Deficiency, Fractures)  Result Value Ref Range   VITD 72.15 30.00 - 100.00 ng/mL  Phosphorus  Result Value Ref Range   Phosphorus 3.0 2.3 - 4.6 mg/dL  Lipid panel  Result Value Ref Range   Cholesterol 146 0 - 200 mg/dL   Triglycerides 787.9 (H) 0.0 - 149.0 mg/dL   HDL 66.29 (L) >60.99 mg/dL   VLDL 57.5 (H) 0.0 - 59.9 mg/dL   LDL Cholesterol 69 0 - 99 mg/dL   Total CHOL/HDL Ratio 4    NonHDL 111.84   CBC with Differential/Platelet  Result Value Ref Range   WBC 9.2 4.0 - 10.5 K/uL   RBC 5.36 4.22 - 5.81 Mil/uL   Hemoglobin 16.0 13.0 - 17.0 g/dL   HCT 53.4 60.9 - 47.9 %   MCV 86.7 78.0 - 100.0 fl   MCHC 34.4 30.0 - 36.0 g/dL   RDW 86.9 88.4 - 84.4 %   Platelets 253.0 150.0 - 400.0 K/uL   Neutrophils Relative % 62.2 43.0 - 77.0 %   Lymphocytes Relative 27.7 12.0 - 46.0 %   Monocytes Relative 6.1 3.0 - 12.0 %   Eosinophils Relative 3.4 0.0 - 5.0 %   Basophils Relative 0.6 0.0 - 3.0 %   Neutro Abs 5.7 1.4 - 7.7 K/uL   Lymphs Abs 2.6 0.7 - 4.0 K/uL   Monocytes Absolute  0.6 0.1 - 1.0 K/uL   Eosinophils Absolute 0.3 0.0 - 0.7 K/uL   Basophils Absolute 0.1 0.0 - 0.1 K/uL  Comprehensive metabolic panel  Result Value Ref Range   Sodium 140 135 - 145 mEq/L   Potassium 4.2 3.5 - 5.1 mEq/L   Chloride 102 96 - 112 mEq/L   CO2 29 19 - 32 mEq/L   Glucose, Bld 121 (H) 70 - 99 mg/dL   BUN 27 (H) 6 - 23 mg/dL   Creatinine, Ser 8.46 (H) 0.40 - 1.50 mg/dL   Total Bilirubin 0.6 0.2 - 1.2 mg/dL   Alkaline Phosphatase 52 39 - 117 U/L   AST 25 0 - 37 U/L   ALT 33 0 - 53 U/L   Total Protein 7.2 6.0 - 8.3 g/dL   Albumin 4.7 3.5 - 5.2 g/dL   GFR 53.54 (L) >39.99 mL/min   Calcium  10.0 8.4 - 10.5 mg/dL      Assessment & Plan:    Routine Health  Maintenance and Physical Exam  Problem List Items Addressed This Visit     Encounter for general adult medical examination with abnormal findings - Primary   Preventive health care reviewed.  PSA per urologist. Counseling on healthy lifestyle including diet and exercise.  Recommend regular dental and eye exams.  Immunizations reviewed.  Discussed safety.       History of moderate sun exposure   Referral to dermatologist for skin exam. Recommend using sunscreen. He is outdoors often, plays golf.       Relevant Orders   Ambulatory referral to Dermatology   Hypercalcemia   Recheck today along with phosphorous. Normal PTH      Relevant Orders   CBC with Differential/Platelet (Completed)   Phosphorus (Completed)   VITAMIN D  25 Hydroxy (Vit-D Deficiency, Fractures) (Completed)   Mixed hyperlipidemia   Controlled. Continue medications and low fat diet. Exercise. Check fasting lipids.        Relevant Orders   Lipid panel (Completed)   Stage 3b chronic kidney disease (HCC)   Managed by nephrologist. Recommend good control of HTN, DM and HLD      Relevant Orders   Comprehensive metabolic panel (Completed)   CBC with Differential/Platelet (Completed)   Type 2 diabetes mellitus with stage 3a chronic kidney disease, without long-term current use of insulin (HCC)   Continue current medications. Monitor BS at home. Low sugar, low carb diet. Check A1c and follow up      Relevant Medications   Continuous Glucose Sensor (DEXCOM G7 SENSOR) MISC    Return in about 3 months (around 11/30/2023) for diabetes. please schedule AWV with nurse .     Boby Mackintosh, NP-C

## 2023-09-01 NOTE — Assessment & Plan Note (Signed)
Referral to dermatologist for skin exam. Recommend using sunscreen. He is outdoors often, plays golf.

## 2023-09-01 NOTE — Patient Instructions (Addendum)
Please go downstairs for labs.   You should hear from [facility] to schedule a skin cancer screening visit.

## 2023-09-01 NOTE — Assessment & Plan Note (Signed)
Preventive health care reviewed.  PSA per urologist. Counseling on healthy lifestyle including diet and exercise.  Recommend regular dental and eye exams.  Immunizations reviewed.  Discussed safety.

## 2023-11-05 ENCOUNTER — Other Ambulatory Visit: Payer: Self-pay | Admitting: Family Medicine

## 2023-11-05 DIAGNOSIS — E1122 Type 2 diabetes mellitus with diabetic chronic kidney disease: Secondary | ICD-10-CM

## 2023-11-11 ENCOUNTER — Other Ambulatory Visit: Payer: Self-pay | Admitting: Family Medicine

## 2023-11-11 DIAGNOSIS — M109 Gout, unspecified: Secondary | ICD-10-CM

## 2023-11-12 ENCOUNTER — Other Ambulatory Visit: Payer: Self-pay | Admitting: Family Medicine

## 2023-11-12 DIAGNOSIS — N1831 Chronic kidney disease, stage 3a: Secondary | ICD-10-CM

## 2023-11-24 ENCOUNTER — Other Ambulatory Visit: Payer: Self-pay | Admitting: Family Medicine

## 2023-11-24 DIAGNOSIS — N1831 Chronic kidney disease, stage 3a: Secondary | ICD-10-CM

## 2023-12-01 ENCOUNTER — Encounter: Payer: Self-pay | Admitting: Family Medicine

## 2023-12-01 ENCOUNTER — Ambulatory Visit (INDEPENDENT_AMBULATORY_CARE_PROVIDER_SITE_OTHER): Payer: Medicare Other | Admitting: Family Medicine

## 2023-12-01 VITALS — BP 132/80 | HR 60 | Temp 97.6°F | Ht 70.0 in | Wt 217.0 lb

## 2023-12-01 DIAGNOSIS — E782 Mixed hyperlipidemia: Secondary | ICD-10-CM

## 2023-12-01 DIAGNOSIS — N1831 Chronic kidney disease, stage 3a: Secondary | ICD-10-CM

## 2023-12-01 DIAGNOSIS — N183 Chronic kidney disease, stage 3 unspecified: Secondary | ICD-10-CM | POA: Diagnosis not present

## 2023-12-01 DIAGNOSIS — Z7985 Long-term (current) use of injectable non-insulin antidiabetic drugs: Secondary | ICD-10-CM | POA: Diagnosis not present

## 2023-12-01 DIAGNOSIS — E1122 Type 2 diabetes mellitus with diabetic chronic kidney disease: Secondary | ICD-10-CM | POA: Diagnosis not present

## 2023-12-01 DIAGNOSIS — I129 Hypertensive chronic kidney disease with stage 1 through stage 4 chronic kidney disease, or unspecified chronic kidney disease: Secondary | ICD-10-CM

## 2023-12-01 LAB — COMPREHENSIVE METABOLIC PANEL WITH GFR
ALT: 33 U/L (ref 0–53)
AST: 25 U/L (ref 0–37)
Albumin: 4.8 g/dL (ref 3.5–5.2)
Alkaline Phosphatase: 58 U/L (ref 39–117)
BUN: 24 mg/dL — ABNORMAL HIGH (ref 6–23)
CO2: 29 meq/L (ref 19–32)
Calcium: 10.3 mg/dL (ref 8.4–10.5)
Chloride: 102 meq/L (ref 96–112)
Creatinine, Ser: 1.58 mg/dL — ABNORMAL HIGH (ref 0.40–1.50)
GFR: 44.62 mL/min — ABNORMAL LOW (ref >60.00)
Glucose, Bld: 134 mg/dL — ABNORMAL HIGH (ref 70–99)
Potassium: 4.8 meq/L (ref 3.5–5.1)
Sodium: 139 meq/L (ref 135–145)
Total Bilirubin: 0.6 mg/dL (ref 0.2–1.2)
Total Protein: 7.4 g/dL (ref 6.0–8.3)

## 2023-12-01 LAB — CBC
HCT: 46.7 % (ref 39.0–52.0)
Hemoglobin: 16 g/dL (ref 13.0–17.0)
MCHC: 34.3 g/dL (ref 30.0–36.0)
MCV: 85.2 fl (ref 78.0–100.0)
Platelets: 254 10*3/uL (ref 150.0–400.0)
RBC: 5.49 Mil/uL (ref 4.22–5.81)
RDW: 13.2 % (ref 11.5–15.5)
WBC: 7.2 10*3/uL (ref 4.0–10.5)

## 2023-12-01 LAB — MICROALBUMIN / CREATININE URINE RATIO
Creatinine,U: 128.1 mg/dL
Microalb Creat Ratio: 71.9 mg/g — ABNORMAL HIGH (ref 0.0–30.0)
Microalb, Ur: 9.2 mg/dL — ABNORMAL HIGH (ref 0.0–1.9)

## 2023-12-01 LAB — LIPID PANEL
Cholesterol: 147 mg/dL (ref 0–200)
HDL: 33.2 mg/dL — ABNORMAL LOW (ref >39.00)
LDL Cholesterol: 72 mg/dL (ref 0–99)
NonHDL: 113.75
Total CHOL/HDL Ratio: 4
Triglycerides: 210 mg/dL — ABNORMAL HIGH (ref 0.0–149.0)
VLDL: 42 mg/dL — ABNORMAL HIGH (ref 0.0–40.0)

## 2023-12-01 LAB — TSH: TSH: 1.22 u[IU]/mL (ref 0.35–5.50)

## 2023-12-01 LAB — HEMOGLOBIN A1C: Hgb A1c MFr Bld: 7.4 % — ABNORMAL HIGH (ref 4.6–6.5)

## 2023-12-01 MED ORDER — OZEMPIC (2 MG/DOSE) 8 MG/3ML ~~LOC~~ SOPN: 2.0000 mg | PEN_INJECTOR | SUBCUTANEOUS | 2 refills | Status: DC

## 2023-12-01 NOTE — Assessment & Plan Note (Addendum)
 Fairly well controlled. Continue monitoring and let me know if BP is consistently higher than 130/80. Continue current medications, exercise and low-sodium diet.  Check renal function today.  Continue follow up with nephrologist.

## 2023-12-01 NOTE — Patient Instructions (Signed)
 Please go downstairs for labs and a urine test before you leave.  I will be in touch with your results and with recommendations.  I will see you back in 6 months or sooner if you need me

## 2023-12-01 NOTE — Assessment & Plan Note (Signed)
 Continue current medications. Monitor BS at home. Low sugar, low carb diet. Check A1c and follow up.  Urine microalbumin ordered.  Continue regular eye exams.

## 2023-12-01 NOTE — Assessment & Plan Note (Signed)
 Recheck today along with PTH.

## 2023-12-01 NOTE — Progress Notes (Signed)
 Subjective:     Patient ID: Samuel Sanders, male    DOB: [birth year], 68 y.o.   MRN: [id number]  Chief Complaint  Patient presents with   Medical Management of Chronic Issues    3 month f/u    HPI   History of Present Illness          He is here to follow up on chronic health conditions.   Last A1c in October 7.5%  He is taking Ozempic 1 mg weekly. Would like to increase dose based on appetite suppression not being as good and to improve blood sugars.   Drinks Fairlife protein shake in the morning  Eats a lot of cheese   Rarely drinks alcohol, none in the past year.  Eats chicken, pork and 2 -3 days per week red meat   Plays a lot of golf.  Married.  Plans to retire in 2026.       Health Maintenance Due  Topic Date Due   Zoster Vaccines- Shingrix (1 of 2) Never done   Medicare Annual Wellness (AWV)  11/16/2021   Diabetic kidney evaluation - Urine ACR  08/22/2023    Past Medical History:  Diagnosis Date   Essential hypertension    Hx of adenomatous colonic polyps 01/2020   Hyperlipidemia    Type 2 diabetes mellitus (HCC)     Past Surgical History:  Procedure Laterality Date   COLONOSCOPY  2009   normal   KNEE ARTHROSCOPY Left 1983   TENDON REPAIR Right 1990   wrist   TONSILLECTOMY  1958    Family History  Problem Relation Age of Onset   Cancer Mother    Healthy Father    Brain cancer Sister    Colon cancer Neg Hx    Colon polyps Neg Hx    Esophageal cancer Neg Hx    Rectal cancer Neg Hx    Stomach cancer Neg Hx     Social History   Socioeconomic History   Marital status: Married    Spouse name: Not on file   Number of children: Not on file   Years of education: Not on file   Highest education level: 12th grade  Occupational History   Not on file  Tobacco Use   Smoking status: Former    Current packs/day: 0.00    Average packs/day: 1 pack/day for 15.0 years (15.0 ttl pk-yrs)    Types: Cigarettes    Quit date: 06/28/2004    Years  since quitting: 19.4   Smokeless tobacco: Never  Vaping Use   Vaping status: Never Used  Substance and Sexual Activity   Alcohol use: Not Currently    Comment: rare beer monthly or so   Drug use: Never   Sexual activity: Yes    Birth control/protection: None  Other Topics Concern   Not on file  Social History Narrative   Not on file   Social Drivers of Health   Financial Resource Strain: Low Risk  (11/30/2023)   Overall Financial Resource Strain (CARDIA)    Difficulty of Paying Living Expenses: Not hard at all  Food Insecurity: No Food Insecurity (11/30/2023)   Hunger Vital Sign    Worried About Running Out of Food in the Last Year: Never true    Ran Out of Food in the Last Year: Never true  Transportation Needs: No Transportation Needs (11/30/2023)   PRAPARE - Administrator, Civil Service (Medical): No    Lack of Transportation (Non-Medical):  No  Physical Activity: Insufficiently Active (11/30/2023)   Exercise Vital Sign    Days of Exercise per Week: 2 days    Minutes of Exercise per Session: 20 min  Stress: No Stress Concern Present (11/30/2023)   [employer] of Occupational Health - Occupational Stress Questionnaire    Feeling of Stress : Only a little  Social Connections: Socially Integrated (11/30/2023)   Social Connection and Isolation Panel [NHANES]    Frequency of Communication with Friends and Family: Once a week    Frequency of Social Gatherings with Friends and Family: Twice a week    Attends Religious Services: 1 to 4 times per year    Active Member of [employer] or Organizations: No    Attends Engineer, structural: 1 to 4 times per year    Marital Status: Married  Catering manager Violence: Not on file    Outpatient Medications Prior to Visit  Medication Sig Dispense Refill   allopurinol (ZYLOPRIM) 100 MG tablet TAKE ONE TABLET BY MOUTH ONCE A DAY 90 tablet 0   amLODipine-benazepril (LOTREL) 5-20 MG capsule TAKE ONE CAPSULE BY MOUTH ONCE A  DAY 90 capsule 1   anastrozole (ARIMIDEX) 1 MG tablet Take 1 mg by mouth daily.     aspirin EC 81 MG tablet Take 81 mg by mouth daily.     atorvastatin (LIPITOR) 20 MG tablet TAKE ONE TABLET BY MOUTH ONCE A DAY 90 tablet 1   Continuous Glucose Sensor (DEXCOM G7 SENSOR) MISC Use to check blood sugars continuously. 2 each 5   fenofibrate (TRICOR) 145 MG tablet TAKE ONE TABLET BY MOUTH ONCE A DAY 90 tablet 0   glimepiride (AMARYL) 2 MG tablet take 1 tablet by mouth twice a day before a meal 180 tablet 1   metFORMIN (GLUCOPHAGE) 1000 MG tablet TAKE HALF TABLET BY MOUTH IN THE MORNING AND TAKE ONE AND ONE HALF TABLET IN THE EVENING 180 tablet 0   Omega-3 1000 MG CAPS Take by mouth.     testosterone cypionate (DEPOTESTOSTERONE CYPIONATE) 200 MG/ML injection Inject 200 mg into the muscle once a week.     Semaglutide, 1 MG/DOSE, (OZEMPIC, 1 MG/DOSE,) 4 MG/3ML SOPN INJECT 1 MG ONCE A WEEK AS DIRECTED 3 mL 3   No facility-administered medications prior to visit.    No Known Allergies  Review of Systems  Constitutional:  Positive for weight loss. Negative for chills, fever and malaise/fatigue.  Eyes:  Negative for blurred vision and double vision.  Respiratory:  Negative for shortness of breath.   Cardiovascular:  Negative for chest pain, palpitations and leg swelling.  Gastrointestinal:  Negative for abdominal pain, constipation, diarrhea, nausea and vomiting.  Genitourinary:  Negative for dysuria, frequency and urgency.  Musculoskeletal:  Negative for falls.  Neurological:  Negative for dizziness, tingling, focal weakness and headaches.  Psychiatric/Behavioral:  Negative for depression. The patient is not nervous/anxious.        Objective:    Physical Exam Constitutional:      General: He is not in acute distress.    Appearance: He is not ill-appearing.  Eyes:     Extraocular Movements: Extraocular movements intact.     Conjunctiva/sclera: Conjunctivae normal.  Cardiovascular:     Rate  and Rhythm: Normal rate.  Pulmonary:     Effort: Pulmonary effort is normal.  Musculoskeletal:     Cervical back: Normal range of motion and neck supple.     Right lower leg: No edema.     Left  lower leg: No edema.  Skin:    General: Skin is warm and dry.  Neurological:     General: No focal deficit present.     Mental Status: He is alert and oriented to person, place, and time.     Motor: No weakness.     Coordination: Coordination normal.     Gait: Gait normal.  Psychiatric:        Mood and Affect: Mood normal.        Behavior: Behavior normal.        Thought Content: Thought content normal.      BP 132/80 (BP Location: Left Arm, Patient Position: Sitting)   Pulse 60   Temp 97.6 F (36.4 C) (Temporal)   Ht 5\' 10"  (1.778 m)   Wt 217 lb (98.4 kg)   SpO2 99%   BMI 31.14 kg/m  Wt Readings from Last 3 Encounters:  12/01/23 217 lb (98.4 kg)  09/01/23 219 lb (99.3 kg)  06/25/23 218 lb 9.6 oz (99.2 kg)       Assessment & Plan:   Problem List Items Addressed This Visit     Benign hypertension with CKD (chronic kidney disease) stage III (HCC)   Fairly well controlled. Continue monitoring and let me know if BP is consistently higher than 130/80. Continue current medications, exercise and low-sodium diet.  Check renal function today.  Continue follow up with nephrologist.       Relevant Orders   Comprehensive metabolic panel with GFR   TSH   CBC   Hypercalcemia   Recheck today along with PTH.      Relevant Orders   PTH, intact and calcium   TSH   Mixed hyperlipidemia   Controlled lipids and elevated triglycerides. Continue medications and low fat diet. Exercise. Check fasting lipids.        Relevant Orders   Lipid panel   Type 2 diabetes mellitus with stage 3a chronic kidney disease, without long-term current use of insulin (HCC) - Primary   Continue current medications. Monitor BS at home. Low sugar, low carb diet. Check A1c and follow up.  Urine microalbumin  ordered.  Continue regular eye exams.      Relevant Medications   Semaglutide, 2 MG/DOSE, (OZEMPIC, 2 MG/DOSE,) 8 MG/3ML SOPN   Other Relevant Orders   Comprehensive metabolic panel with GFR   Hemoglobin A1c   TSH   Microalbumin / creatinine urine ratio   CBC   Recommend that he get the Shingrix vaccine at his pharmacy  I have discontinued Cyd Silence. Chovan's Ozempic (1 MG/DOSE). I am also having him start on Ozempic (2 MG/DOSE). Additionally, I am having him maintain his testosterone cypionate, Omega-3, aspirin EC, anastrozole, glimepiride, amLODipine-benazepril, Dexcom G7 Sensor, atorvastatin, allopurinol, fenofibrate, and metFORMIN.  Meds ordered this encounter  Medications   Semaglutide, 2 MG/DOSE, (OZEMPIC, 2 MG/DOSE,) 8 MG/3ML SOPN    Sig: Inject 2 mg into the skin once a week.    Dispense:  3 mL    Refill:  2    Supervising Provider:   Hillard Danker A [[id number]]

## 2023-12-01 NOTE — Assessment & Plan Note (Signed)
 Controlled lipids and elevated triglycerides. Continue medications and low fat diet. Exercise. Check fasting lipids.

## 2023-12-02 ENCOUNTER — Encounter: Payer: Self-pay | Admitting: Family Medicine

## 2023-12-03 LAB — PTH, INTACT AND CALCIUM
Calcium: 10.3 mg/dL (ref 8.6–10.3)
PTH: 16 pg/mL (ref 16–77)

## 2023-12-26 ENCOUNTER — Other Ambulatory Visit: Payer: Self-pay | Admitting: Family Medicine

## 2023-12-28 ENCOUNTER — Encounter: Payer: Self-pay | Admitting: Family Medicine

## 2024-01-31 DIAGNOSIS — C439 Malignant melanoma of skin, unspecified: Secondary | ICD-10-CM

## 2024-01-31 HISTORY — DX: Malignant melanoma of skin, unspecified: C43.9

## 2024-02-02 ENCOUNTER — Other Ambulatory Visit: Payer: Self-pay | Admitting: Family Medicine

## 2024-02-02 DIAGNOSIS — N1831 Chronic kidney disease, stage 3a: Secondary | ICD-10-CM

## 2024-02-10 ENCOUNTER — Other Ambulatory Visit: Payer: Self-pay | Admitting: Family Medicine

## 2024-02-10 DIAGNOSIS — M109 Gout, unspecified: Secondary | ICD-10-CM

## 2024-02-10 DIAGNOSIS — E1122 Type 2 diabetes mellitus with diabetic chronic kidney disease: Secondary | ICD-10-CM

## 2024-02-18 ENCOUNTER — Encounter: Payer: Self-pay | Admitting: Dermatology

## 2024-02-18 ENCOUNTER — Ambulatory Visit: Admitting: Dermatology

## 2024-02-18 VITALS — BP 132/74 | HR 70

## 2024-02-18 DIAGNOSIS — L814 Other melanin hyperpigmentation: Secondary | ICD-10-CM | POA: Diagnosis not present

## 2024-02-18 DIAGNOSIS — D2239 Melanocytic nevi of other parts of face: Secondary | ICD-10-CM | POA: Diagnosis not present

## 2024-02-18 DIAGNOSIS — D225 Melanocytic nevi of trunk: Secondary | ICD-10-CM

## 2024-02-18 DIAGNOSIS — D485 Neoplasm of uncertain behavior of skin: Secondary | ICD-10-CM | POA: Diagnosis not present

## 2024-02-18 DIAGNOSIS — D492 Neoplasm of unspecified behavior of bone, soft tissue, and skin: Secondary | ICD-10-CM | POA: Diagnosis not present

## 2024-02-18 DIAGNOSIS — W908XXA Exposure to other nonionizing radiation, initial encounter: Secondary | ICD-10-CM

## 2024-02-18 DIAGNOSIS — L578 Other skin changes due to chronic exposure to nonionizing radiation: Secondary | ICD-10-CM | POA: Diagnosis not present

## 2024-02-18 DIAGNOSIS — D229 Melanocytic nevi, unspecified: Secondary | ICD-10-CM

## 2024-02-18 DIAGNOSIS — Z1283 Encounter for screening for malignant neoplasm of skin: Secondary | ICD-10-CM | POA: Diagnosis not present

## 2024-02-18 DIAGNOSIS — L821 Other seborrheic keratosis: Secondary | ICD-10-CM

## 2024-02-18 DIAGNOSIS — D1801 Hemangioma of skin and subcutaneous tissue: Secondary | ICD-10-CM

## 2024-02-18 NOTE — Patient Instructions (Addendum)

## 2024-02-18 NOTE — Progress Notes (Signed)
 New Patient Visit   Subjective  Samuel Sanders is a 68 y.o. male who presents for the following: Skin Cancer Screening and Full Body Skin Exam  The patient presents for Total-Body Skin Exam (TBSE) for skin cancer screening and mole check. The patient has spots, moles and lesions to be evaluated, some may be new or changing.  Pt has no hx of skin cancer nor family hx. Patient plays a lot of golf and spends a lot of time outside.   The following portions of the chart were reviewed this encounter and updated as appropriate: medications, allergies, medical history  Review of Systems:  No other skin or systemic complaints except as noted in HPI or Assessment and Plan.  Objective  Well appearing patient in no apparent distress; mood and affect are within normal limits.  A full examination was performed including scalp, head, eyes, ears, nose, lips, neck, chest, axillae, abdomen, back, buttocks, bilateral upper extremities, bilateral lower extremities, hands, feet, fingers, toes, fingernails, and toenails. All findings within normal limits unless otherwise noted below.   Relevant physical exam findings are noted in the Assessment and Plan.  Right Buccal Cheek 1.5 brown plaque  mid chest 6mm pink brown macule   Assessment & Plan   SKIN CANCER SCREENING PERFORMED TODAY.  ACTINIC DAMAGE - Chronic condition, secondary to cumulative UV/sun exposure - diffuse scaly erythematous macules with underlying dyspigmentation - Recommend daily broad spectrum sunscreen SPF 30+ to sun-exposed areas, reapply every 2 hours as needed.  - Staying in the shade or wearing long sleeves, sun glasses (UVA+UVB protection) and wide brim hats (4-inch brim around the entire circumference of the hat) are also recommended for sun protection.  - Call for new or changing lesions.  MELANOCYTIC NEVI - Tan-brown and/or pink-flesh-colored symmetric macules and papules - Benign appearing on exam today -  Observation - Call clinic for new or changing moles - Recommend daily use of broad spectrum spf 30+ sunscreen to sun-exposed areas.   LENTIGINES Exam: scattered tan macules Due to sun exposure Treatment Plan: Benign-appearing, observe. Recommend daily broad spectrum sunscreen SPF 30+ to sun-exposed areas, reapply every 2 hours as needed.  Call for any changes   HEMANGIOMA Exam: red papule(s) Discussed benign nature. Recommend observation. Call for changes.   SEBORRHEIC KERATOSIS - Stuck-on, waxy, tan-brown papules and/or plaques  - Benign-appearing - Discussed benign etiology and prognosis. - Observe - Call for any changes   NEOPLASM OF UNCERTAIN BEHAVIOR OF SKIN (2) Right Buccal Cheek Skin / nail biopsy Type of biopsy: tangential   Informed consent: discussed and consent obtained   Timeout: patient name, date of birth, surgical site, and procedure verified   Procedure prep:  Patient was prepped and draped in usual sterile fashion Prep type:  Isopropyl alcohol Anesthesia: the lesion was anesthetized in a standard fashion   Anesthetic:  1% lidocaine w/ epinephrine 1-100,000 buffered w/ 8.4% NaHCO3 Instrument used: DermaBlade   Hemostasis achieved with: pressure and aluminum chloride   Outcome: patient tolerated procedure well   Post-procedure details: sterile dressing applied and wound care instructions given   Dressing type: bandage and pressure dressing   Specimen 1 - Surgical pathology Differential Diagnosis: R/O LM vs SK  Check Margins: No mid chest Skin / nail biopsy Type of biopsy: tangential   Informed consent: discussed and consent obtained   Timeout: patient name, date of birth, surgical site, and procedure verified   Procedure prep:  Patient was prepped and draped in usual sterile fashion  Prep type:  Isopropyl alcohol Anesthesia: the lesion was anesthetized in a standard fashion   Anesthetic:  1% lidocaine w/ epinephrine 1-100,000 buffered w/ 8.4%  NaHCO3 Instrument used: DermaBlade   Hemostasis achieved with: pressure and aluminum chloride   Outcome: patient tolerated procedure well   Post-procedure details: sterile dressing applied and wound care instructions given   Dressing type: bandage and pressure dressing   Specimen 2 - Surgical pathology Differential Diagnosis: R/O NMSC vs other  Check Margins: No ACTINIC SKIN DAMAGE   MULTIPLE BENIGN NEVI   LENTIGINES   CHERRY ANGIOMA   SEBORRHEIC KERATOSES    Return in about 1 year (around 02/17/2025) for TBSE.  I, Wilson Hasten, CMA, am acting as scribe for Deneise Finlay, MD.   Documentation: I have reviewed the above documentation for accuracy and completeness, and I agree with the above.  Deneise Finlay, MD

## 2024-02-19 ENCOUNTER — Other Ambulatory Visit: Payer: Self-pay | Admitting: Family Medicine

## 2024-02-19 DIAGNOSIS — E1122 Type 2 diabetes mellitus with diabetic chronic kidney disease: Secondary | ICD-10-CM

## 2024-02-19 DIAGNOSIS — N1831 Chronic kidney disease, stage 3a: Secondary | ICD-10-CM

## 2024-02-23 ENCOUNTER — Other Ambulatory Visit: Payer: Self-pay | Admitting: Family Medicine

## 2024-02-23 LAB — SURGICAL PATHOLOGY

## 2024-02-24 ENCOUNTER — Ambulatory Visit: Payer: Self-pay | Admitting: Dermatology

## 2024-02-24 NOTE — Telephone Encounter (Signed)
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

## 2024-02-24 NOTE — Telephone Encounter (Signed)
 Patient aware of results and treatment plan. Message has been sent to the schedulers to contact and make appointment.

## 2024-03-01 DIAGNOSIS — N1831 Chronic kidney disease, stage 3a: Secondary | ICD-10-CM | POA: Diagnosis not present

## 2024-03-07 DIAGNOSIS — I129 Hypertensive chronic kidney disease with stage 1 through stage 4 chronic kidney disease, or unspecified chronic kidney disease: Secondary | ICD-10-CM | POA: Diagnosis not present

## 2024-03-07 DIAGNOSIS — N1831 Chronic kidney disease, stage 3a: Secondary | ICD-10-CM | POA: Diagnosis not present

## 2024-03-07 DIAGNOSIS — E1122 Type 2 diabetes mellitus with diabetic chronic kidney disease: Secondary | ICD-10-CM | POA: Diagnosis not present

## 2024-03-20 ENCOUNTER — Other Ambulatory Visit: Payer: Self-pay | Admitting: Family Medicine

## 2024-03-20 DIAGNOSIS — N1831 Chronic kidney disease, stage 3a: Secondary | ICD-10-CM

## 2024-03-20 DIAGNOSIS — N183 Chronic kidney disease, stage 3 unspecified: Secondary | ICD-10-CM

## 2024-03-21 ENCOUNTER — Encounter: Payer: Self-pay | Admitting: Dermatology

## 2024-03-24 ENCOUNTER — Encounter: Payer: Self-pay | Admitting: Dermatology

## 2024-03-24 ENCOUNTER — Ambulatory Visit: Admitting: Dermatology

## 2024-03-24 VITALS — BP 131/87 | HR 62 | Temp 98.1°F

## 2024-03-24 DIAGNOSIS — C439 Malignant melanoma of skin, unspecified: Secondary | ICD-10-CM

## 2024-03-24 DIAGNOSIS — D0339 Melanoma in situ of other parts of face: Secondary | ICD-10-CM

## 2024-03-24 DIAGNOSIS — L814 Other melanin hyperpigmentation: Secondary | ICD-10-CM | POA: Diagnosis not present

## 2024-03-24 DIAGNOSIS — L579 Skin changes due to chronic exposure to nonionizing radiation, unspecified: Secondary | ICD-10-CM | POA: Diagnosis not present

## 2024-03-24 MED ORDER — OXYCODONE HCL 5 MG PO TABS
5.0000 mg | ORAL_TABLET | Freq: Four times a day (QID) | ORAL | 0 refills | Status: DC | PRN
Start: 2024-03-24 — End: 2024-06-01

## 2024-03-24 NOTE — Patient Instructions (Signed)

## 2024-03-24 NOTE — Progress Notes (Signed)
 Follow-Up Visit   Subjective  Samuel Sanders is a 68 y.o. male who presents for the following: Mohs of an Atypical Junctional Lentiginous Proliferation, cannot rule out Lentigo Maligna, of the right buccal cheek, biopsied by Dr. Corey.   The following portions of the chart were reviewed this encounter and updated as appropriate: medications, allergies, medical history  Review of Systems:  No other skin or systemic complaints except as noted in HPI or Assessment and Plan.  Objective  Well appearing patient in no apparent distress; mood and affect are within normal limits.  A focused examination was performed of the following areas: Right buccal cheek Relevant physical exam findings are noted in the Assessment and Plan.   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 No    Anesthesia: Anesthesia method: local infiltration Local anesthetic: lidocaine 1% WITH epi  Procedure Details: Timeout: pre-procedure verification complete Procedure Prep: patient was prepped and draped in usual sterile fashion Prep type: chlorhexidine Pre-Op diagnosis: melanoma Other pre-op diagnosis: ATYPICAL JUNCTIONAL LENTIGINOUS MELANOCYTIC PROLIFERATION Melanoma subtype: in situ Melanoma histologic subtype: lentigo maligna MohsAIQ Surgical site (if tumor spans multiple areas, please select predominant area): cheek (including jawline) Surgery side: right Surgical site (from skin exam): Right Buccal Cheek Pre-operative length (cm): 1.6 Pre-operative width (cm): 1.3 Indications for Mohs surgery: anatomic location where tissue conservation is critical and ill-defined borders  Micrographic Surgery Details: Post-operative length (cm): 3.4 Post-operative width (cm): 3 Number of Mohs stages: 2 Cumulative additional  sections past 5 per stage: 0 Post surgery depth of defect: subcutaneous fat  Stage 1    Tumor features identified on Mohs section: melanoma    Depth of tumor invasion after stage: dermis  Stage 2    Tumor features identified on Mohs section: no tumor identified  Reconstruction: Was the defect reconstructed? Yes   Was reconstruction performed by the same Mohs surgeon? Yes   Setting of reconstruction: outpatient office When was reconstruction performed? same day Type of reconstruction: linear  Skin repair Complexity:  Complex Final length (cm):  7 Informed consent: discussed and consent obtained   Timeout: patient name, date of birth, surgical site, and procedure verified   Procedure prep:  Patient was prepped and draped in usual sterile fashion Prep type:  Chlorhexidine Anesthesia: the lesion was anesthetized in a standard fashion   Anesthetic:  1% lidocaine w/ epinephrine 1-100,000 buffered w/ 8.4% NaHCO3 Reason for type of repair: reduce tension to allow closure, preserve normal anatomy, preserve normal anatomical and functional relationships and avoid adjacent structures   Undermining: area extensively undermined   Subcutaneous layers (deep stitches):  Suture size:  5-0 Suture type: Monocryl (poliglecaprone 25)   Stitches:  Buried vertical mattress Fine/surface layer approximation (top stitches):  Suture size:  6-0 Suture type: fast-absorbing plain gut   Stitches: simple running   Hemostasis achieved with: suture, pressure and electrodesiccation Outcome: patient tolerated procedure well with no complications   Post-procedure details: sterile dressing applied and wound care instructions given   Dressing type: pressure dressing and petrolatum    Related Medications oxyCODONE  (OXY IR/ROXICODONE ) 5 MG immediate release tablet Take 1 tablet (5 mg total) by mouth every 6 (six) hours as needed for up to 8 doses.   Return in about 2 weeks (around 04/07/2024) for follow up.  LILLETTE Darice Smock, CMA,  am acting as scribe for RUFUS CHRISTELLA HOLY, MD.    03/24/2024  HISTORY OF PRESENT ILLNESS  Samuel Sanders is seen in consultation at the request of Dr. HOLY for biopsy-proven Atypical Lentiginous Melanocytic Proliferation, cannot rule out Lentigo Maligna. They note that the area has been present for about 1 year increasing in size with time.  There is no history of previous treatment.  Reports no other new or changing lesions and has no other complaints today.  Medications and allergies: see patient chart.  Review of systems: Reviewed 8 systems and notable for the above skin cancer.  All other systems reviewed are unremarkable/negative, unless noted in the HPI. Past medical history, surgical history, family history, social history were also reviewed and are noted in the chart/questionnaire.    PHYSICAL EXAMINATION  General: Well-appearing, in no acute distress, alert and oriented x 4. Vitals reviewed in chart (if available).   Skin: Exam reveals a 1.6 x 1.3 cm erythematous papule and biopsy scar on the right buccal cheek. There are rhytids, telangiectasias, and lentigines, consistent with photodamage.   Biopsy report(s) reviewed, confirming the diagnosis.   ASSESSMENT  1) Atypical Junctional Lentiginous  Proliferation, cannot rule out Lentigo Maligna of the right buccal cheek 2) photodamage 3) solar lentigines   PLAN   1. Due to location, size, histology, or recurrence and the likelihood of subclinical extension as well as the need to conserve normal surrounding tissue, the patient was deemed acceptable for Mohs micrographic surgery (MMS).  The nature and purpose of the procedure, associated benefits and risks including recurrence and scarring, possible complications such as pain, infection, and bleeding, and alternative methods of treatment if appropriate were discussed with the patient during consent. The lesion location was verified by the patient, by reviewing previous  notes, pathology reports, and by photographs as well as angulation measurements if available.  Informed consent was reviewed and signed by the patient, and timeout was performed at 8:15 AM. See op note below.  2. For the photodamage and solar lentigines, sun protection discussed/information given on OTC sunscreens, and we recommend continued regular follow-up with primary dermatologist every 6 months or sooner for any growing, bleeding, or changing lesions. 3. Prognosis and future surveillance discussed. 4. Letter with treatment outcome sent to referring provider. 5. Pain acetaminophen /ibuprofen/oxycodone  5 mg   MOHS MICROGRAPHIC SURGERY AND RECONSTRUCTION  Initial size:   1.6 x 1.3 cm Surgical defect/wound size: 3.4 x 3.0 cm Anesthesia:    0.33% lidocaine with 1:200,000 epinephrine EBL:    <5 mL Complications:  None Repair type:   Complex SQ suture:   5-0 Monocryl Cutaneous suture:  6-0 Plain gut Final size of the repair: 7.0 cm  Stages: 2  STAGE I: Anesthesia achieved with 0.5% lidocaine with 1:200,000 epinephrine. ChloraPrep applied. 5 section(s) excised using Mohs technique (this includes total peripheral and deep tissue margin excision and evaluation with frozen sections, excised and interpreted by the same physician). The tumor was first debulked and then excised with an approx. 2 mm margin.  Hemostasis was achieved with electrocautery as needed.  The specimen was then oriented, subdivided/relaxed, inked, and processed using Mohs technique.  Tissue was stained with H&E and MART-1 with 2 chromogens (2 immunostains).  Frozen section analysis revealed a positive margin for atypical proliferation of melanocytes arranged in a confluent and pagetoid pattern along the basal and suprabasal layers. The melanocytes exhibit nuclear pleomorphism, hyperchromasia, and prominent nucleoli. There is an increased number of melanocytes at the dermoepidermal junction with  focal nesting and single-cell  spread. No evidence of dermal invasion is seen in the peripheral margin. One focus of potential melanocytes within the dermis.     STAGE II: An additional 2 mm margin was excised.  Hemostasis was achieved with electrocautery as needed.  The specimen was then oriented, subdivided/relaxed, inked, and processed using Mohs technique. Evaluation of slides by the Mohs surgeon revealed clear tumor margins. Tissue was stained with H&E and MART-1 with 2 chromogens (2 immunostains).   Reconstruction  The surgical wound was then cleaned, prepped, and re-anesthetized as above. Wound edges were undermined extensively along at least one entire edge and at a distance equal to or greater than the width of the defect (see wound defect size above) in order to achieve closure and decrease wound tension and anatomic distortion. Redundant tissue repair including standing cone removal was performed. Hemostasis was achieved with electrocautery. Subcutaneous and epidermal tissues were approximated with the above sutures. The surgical site was then lightly scrubbed with sterile, saline-soaked gauze. The area was then bandaged using Vaseline ointment, non-adherent gauze, gauze pads, and tape to provide an adequate pressure dressing. The patient tolerated the procedure well, was given detailed written and verbal wound care instructions, and was discharged in good condition.   The patient will follow-up: 2 weeks.    I, Darice Smock, CMA, am acting as scribe for RUFUS CHRISTELLA HOLY, MD.   Documentation: I have reviewed the above documentation for accuracy and completeness, and I agree with the above.  RUFUS CHRISTELLA HOLY, MD

## 2024-03-30 ENCOUNTER — Encounter: Payer: Self-pay | Admitting: Dermatology

## 2024-03-31 ENCOUNTER — Encounter: Payer: Self-pay | Admitting: Dermatology

## 2024-04-07 ENCOUNTER — Ambulatory Visit: Admitting: Dermatology

## 2024-04-07 ENCOUNTER — Encounter: Payer: Self-pay | Admitting: Dermatology

## 2024-04-07 VITALS — BP 164/77 | HR 60 | Temp 98.3°F

## 2024-04-07 DIAGNOSIS — Z86006 Personal history of melanoma in-situ: Secondary | ICD-10-CM

## 2024-04-07 DIAGNOSIS — D239 Other benign neoplasm of skin, unspecified: Secondary | ICD-10-CM

## 2024-04-07 DIAGNOSIS — D235 Other benign neoplasm of skin of trunk: Secondary | ICD-10-CM

## 2024-04-07 DIAGNOSIS — L988 Other specified disorders of the skin and subcutaneous tissue: Secondary | ICD-10-CM | POA: Diagnosis not present

## 2024-04-07 DIAGNOSIS — L905 Scar conditions and fibrosis of skin: Secondary | ICD-10-CM

## 2024-04-07 NOTE — Progress Notes (Signed)
 Follow-Up Visit   Subjective  Samuel Sanders is a 68 y.o. 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 Corey.  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  The following portions of the chart were reviewed this encounter and updated as appropriate: medications, allergies, medical history  Review of Systems:  No other skin or systemic complaints except as noted in HPI or Assessment and Plan.  Objective  Well appearing patient in no apparent distress; mood and affect are within normal limits.  A focused examination was performed of the following areas: Mid chest face Relevant physical exam findings are noted in the Assessment and Plan.   mid chest Healing biopsy site      Assessment & Plan   DYSPLASTIC NEVUS mid chest Skin excision - mid chest  Excision method:  elliptical Lesion length (cm):  1 Lesion width (cm):  1.1 Margin per side (cm):  0.5 Total excision diameter (cm):  2.1 Informed consent: discussed and consent obtained   Timeout: patient name, date of birth, surgical site, and procedure verified   Procedure prep:  Patient was prepped and draped in usual sterile fashion Prep type:  Chlorhexidine Anesthesia: the lesion was anesthetized in a standard fashion   Anesthetic:  1% lidocaine w/ epinephrine 1-100,000 buffered w/ 8.4% NaHCO3 Instrument used: #15 blade   Hemostasis achieved with: suture, pressure and electrodesiccation   Hemostasis achieved with comment:  3.0 monocryl with dermabond and sterir strips Outcome: patient tolerated procedure well with no complications   Post-procedure details: wound care instructions given   Additional details:  Final length 4.2  Skin repair - mid chest Complexity:  Complex Final length (cm):  4.2 Informed consent: discussed and consent obtained   Timeout: patient name, date of birth, surgical site,  and procedure verified   Procedure prep:  Patient was prepped and draped in usual sterile fashion Prep type:  Chlorhexidine Anesthesia: the lesion was anesthetized in a standard fashion   Anesthetic:  1% lidocaine w/ epinephrine 1-100,000 buffered w/ 8.4% NaHCO3 Reason for type of repair: reduce tension to allow closure, preserve normal anatomy, preserve normal anatomical and functional relationships and avoid adjacent structures   Undermining: area extensively undermined   Subcutaneous layers (deep stitches):  Suture size:  3-0 Suture type: Monocryl (poliglecaprone 25)   Stitches:  Buried vertical mattress Fine/surface layer approximation (top stitches):  Suture type: cyanoacrylate tissue glue   Hemostasis achieved with: suture, pressure and electrodesiccation Outcome: patient tolerated procedure well with no complications   Post-procedure details: sterile dressing applied and wound care instructions given   Dressing type: bandage and petrolatum    Specimen 1 - Surgical pathology Differential Diagnosis: DN [id number] Check Margins: No SCAR    Scar s/p Mohs for AIMP/Lentigo Maligna, treated on 03/24/2024, repaired with linear closure - Reassured that wound is healing well - No evidence of infection, swelling, redness - Discussed that scars take up to 12 months to mature from the date of surgery - Recommend SPF 30+ to scar daily to prevent purple color - OK to start scar massage at 4-6 weeks post-op - Can consider silicone based products for scar healing  HISTORY OF MELANOMA IN SITU - No evidence of recurrence today - Recommend regular full body skin exams - Recommend daily broad spectrum sunscreen SPF 30+ to sun-exposed areas, reapply every 2 hours as needed.  - Call  if any new or changing lesions are noted between office visits   Return in about 4 weeks (around 05/05/2024) for Wound Check F/U (Excision of DN Chest).  I, Darice Smock, CMA, am acting as scribe for RUFUS CHRISTELLA HOLY, MD.   Documentation: I have reviewed the above documentation for accuracy and completeness, and I agree with the above.  RUFUS CHRISTELLA HOLY, MD

## 2024-04-07 NOTE — Patient Instructions (Signed)

## 2024-04-12 ENCOUNTER — Ambulatory Visit: Payer: Self-pay | Admitting: Dermatology

## 2024-04-12 LAB — SURGICAL PATHOLOGY

## 2024-04-17 ENCOUNTER — Other Ambulatory Visit: Payer: Self-pay | Admitting: Family Medicine

## 2024-04-26 ENCOUNTER — Other Ambulatory Visit: Payer: Self-pay | Admitting: Family Medicine

## 2024-04-26 DIAGNOSIS — E1122 Type 2 diabetes mellitus with diabetic chronic kidney disease: Secondary | ICD-10-CM

## 2024-04-27 ENCOUNTER — Ambulatory Visit: Admitting: Family Medicine

## 2024-04-28 NOTE — Progress Notes (Signed)
 Patient not seen. Welcome to Medicare visit scheduled and not appropriate for this patient.

## 2024-04-30 ENCOUNTER — Other Ambulatory Visit: Payer: Self-pay | Admitting: Family Medicine

## 2024-04-30 DIAGNOSIS — E1122 Type 2 diabetes mellitus with diabetic chronic kidney disease: Secondary | ICD-10-CM

## 2024-05-03 ENCOUNTER — Encounter: Payer: Self-pay | Admitting: Dermatology

## 2024-05-03 ENCOUNTER — Ambulatory Visit: Admitting: Dermatology

## 2024-05-03 VITALS — BP 123/73 | HR 55

## 2024-05-03 DIAGNOSIS — Z86018 Personal history of other benign neoplasm: Secondary | ICD-10-CM | POA: Diagnosis not present

## 2024-05-03 DIAGNOSIS — S21109D Unspecified open wound of unspecified front wall of thorax without penetration into thoracic cavity, subsequent encounter: Secondary | ICD-10-CM | POA: Diagnosis not present

## 2024-05-03 DIAGNOSIS — L905 Scar conditions and fibrosis of skin: Secondary | ICD-10-CM | POA: Diagnosis not present

## 2024-05-03 DIAGNOSIS — Z86006 Personal history of melanoma in-situ: Secondary | ICD-10-CM | POA: Diagnosis not present

## 2024-05-03 DIAGNOSIS — T1490XD Injury, unspecified, subsequent encounter: Secondary | ICD-10-CM

## 2024-05-03 DIAGNOSIS — D239 Other benign neoplasm of skin, unspecified: Secondary | ICD-10-CM

## 2024-05-03 DIAGNOSIS — C439 Malignant melanoma of skin, unspecified: Secondary | ICD-10-CM

## 2024-05-03 NOTE — Patient Instructions (Signed)

## 2024-05-03 NOTE — Progress Notes (Signed)
   Follow Up Visit   Subjective  Samuel Sanders is a 68 y.o. male who presents for the following: follow up from Excision  The patient presents for follow up from an excision for a Dysplastic Nevus on the mid chest, treated on 04/07/2024, repaired with a complex linear closure. The patient has been bandaging the wound as directed. The endorse the following concerns: none. Patient has not been using the scar sheets or the scar massage but is pleased with the result.   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  The following portions of the chart were reviewed this encounter and updated as appropriate: medications, allergies, medical history  Review of Systems:  No other skin or systemic complaints except as noted in HPI or Assessment and Plan.  Objective  Well appearing patient in no apparent distress; mood and affect are within normal limits.  A focal examination was performed including the chest. All findings within normal limits unless otherwise noted below.  Healing wound with mild erythema  Relevant physical exam findings are noted in the Assessment and Plan.       Assessment & Plan   Healing Wound s/p Excision for dysplastic nevus on the mid chest, treated on 04/07/2024, repaired with a complex linear closure. - Reassured that wound is healing well - No evidence of infection - No swelling, induration, purulence, dehiscence, or tenderness out of proportion to the clinical exam, see photo above - Discussed that scars take up to 12 months to mature from the date of surgery - Recommend SPF 30+ to scar daily to prevent purple color from UV exposure during scar maturation process - Discussed that erythema and raised appearance of scar will fade over the next 4-6 months - OK to start scar massage at 4-6 weeks post-op - Can consider silicone based products for scar healing starting at 6 weeks  post-op - Ok to discontinue ointment daily   Scar s/p Mohs for AIMP/Lentigo Maligna, treated on 03/24/2024, repaired with linear closure - Reassured that wound is healing well - No evidence of infection, swelling, redness - Discussed that scars take up to 12 months to mature from the date of surgery - Recommend SPF 30+ to scar daily to prevent purple color - OK to start scar massage at 4-6 weeks post-op - Can consider silicone based products for scar healing   HISTORY OF MELANOMA IN SITU - No evidence of recurrence today - Recommend regular full body skin exams - Recommend daily broad spectrum sunscreen SPF 30+ to sun-exposed areas, reapply every 2 hours as needed.  - Call if any new or changing lesions are noted between office visits  Return in 3 months (on 08/02/2024) for FBSE.  LILLETTE Rollene Gobble, RN, am acting as scribe for RUFUS CHRISTELLA HOLY, MD .   Documentation: I have reviewed the above documentation for accuracy and completeness, and I agree with the above.  RUFUS CHRISTELLA HOLY, MD

## 2024-05-12 ENCOUNTER — Other Ambulatory Visit: Payer: Self-pay | Admitting: Family Medicine

## 2024-05-12 DIAGNOSIS — N1831 Chronic kidney disease, stage 3a: Secondary | ICD-10-CM

## 2024-05-12 DIAGNOSIS — M109 Gout, unspecified: Secondary | ICD-10-CM

## 2024-05-17 ENCOUNTER — Other Ambulatory Visit: Payer: Self-pay | Admitting: Family Medicine

## 2024-05-17 DIAGNOSIS — N1831 Type 2 diabetes mellitus with diabetic chronic kidney disease: Secondary | ICD-10-CM

## 2024-05-26 DIAGNOSIS — E119 Type 2 diabetes mellitus without complications: Secondary | ICD-10-CM | POA: Diagnosis not present

## 2024-05-26 LAB — HM DIABETES EYE EXAM

## 2024-06-01 ENCOUNTER — Ambulatory Visit: Admitting: Family Medicine

## 2024-06-01 ENCOUNTER — Encounter: Payer: Self-pay | Admitting: Family Medicine

## 2024-06-01 VITALS — BP 106/64 | HR 50 | Temp 97.9°F | Ht 70.0 in | Wt 206.0 lb

## 2024-06-01 DIAGNOSIS — M109 Gout, unspecified: Secondary | ICD-10-CM

## 2024-06-01 DIAGNOSIS — N183 Chronic kidney disease, stage 3 unspecified: Secondary | ICD-10-CM | POA: Diagnosis not present

## 2024-06-01 DIAGNOSIS — E669 Obesity, unspecified: Secondary | ICD-10-CM | POA: Diagnosis not present

## 2024-06-01 DIAGNOSIS — E1122 Type 2 diabetes mellitus with diabetic chronic kidney disease: Secondary | ICD-10-CM

## 2024-06-01 DIAGNOSIS — Z23 Encounter for immunization: Secondary | ICD-10-CM | POA: Diagnosis not present

## 2024-06-01 DIAGNOSIS — N1831 Chronic kidney disease, stage 3a: Secondary | ICD-10-CM

## 2024-06-01 DIAGNOSIS — R001 Bradycardia, unspecified: Secondary | ICD-10-CM | POA: Diagnosis not present

## 2024-06-01 DIAGNOSIS — I129 Hypertensive chronic kidney disease with stage 1 through stage 4 chronic kidney disease, or unspecified chronic kidney disease: Secondary | ICD-10-CM

## 2024-06-01 DIAGNOSIS — E782 Mixed hyperlipidemia: Secondary | ICD-10-CM

## 2024-06-01 LAB — CBC WITH DIFFERENTIAL/PLATELET
Basophils Absolute: 0.1 K/uL (ref 0.0–0.1)
Basophils Relative: 1.3 % (ref 0.0–3.0)
Eosinophils Absolute: 0.1 K/uL (ref 0.0–0.7)
Eosinophils Relative: 2.1 % (ref 0.0–5.0)
HCT: 43.8 % (ref 39.0–52.0)
Hemoglobin: 14.9 g/dL (ref 13.0–17.0)
Lymphocytes Relative: 29.8 % (ref 12.0–46.0)
Lymphs Abs: 1.9 K/uL (ref 0.7–4.0)
MCHC: 34.1 g/dL (ref 30.0–36.0)
MCV: 84.6 fl (ref 78.0–100.0)
Monocytes Absolute: 0.4 K/uL (ref 0.1–1.0)
Monocytes Relative: 6.2 % (ref 3.0–12.0)
Neutro Abs: 4 K/uL (ref 1.4–7.7)
Neutrophils Relative %: 60.6 % (ref 43.0–77.0)
Platelets: 261 K/uL (ref 150.0–400.0)
RBC: 5.17 Mil/uL (ref 4.22–5.81)
RDW: 13.4 % (ref 11.5–15.5)
WBC: 6.6 K/uL (ref 4.0–10.5)

## 2024-06-01 LAB — MICROALBUMIN / CREATININE URINE RATIO
Creatinine,U: 104.3 mg/dL
Microalb Creat Ratio: 39.4 mg/g — ABNORMAL HIGH (ref 0.0–30.0)
Microalb, Ur: 4.1 mg/dL — ABNORMAL HIGH (ref 0.0–1.9)

## 2024-06-01 LAB — COMPREHENSIVE METABOLIC PANEL WITH GFR
ALT: 22 U/L (ref 0–53)
AST: 20 U/L (ref 0–37)
Albumin: 4.6 g/dL (ref 3.5–5.2)
Alkaline Phosphatase: 45 U/L (ref 39–117)
BUN: 22 mg/dL (ref 6–23)
CO2: 29 meq/L (ref 19–32)
Calcium: 9.9 mg/dL (ref 8.4–10.5)
Chloride: 103 meq/L (ref 96–112)
Creatinine, Ser: 1.39 mg/dL (ref 0.40–1.50)
GFR: 51.85 mL/min — ABNORMAL LOW (ref >60.00)
Glucose, Bld: 132 mg/dL — ABNORMAL HIGH (ref 70–99)
Potassium: 4.6 meq/L (ref 3.5–5.1)
Sodium: 140 meq/L (ref 135–145)
Total Bilirubin: 0.5 mg/dL (ref 0.2–1.2)
Total Protein: 7.1 g/dL (ref 6.0–8.3)

## 2024-06-01 LAB — T4, FREE: Free T4: 0.9 ng/dL (ref 0.60–1.60)

## 2024-06-01 LAB — URIC ACID: Uric Acid, Serum: 4.7 mg/dL (ref 4.0–7.8)

## 2024-06-01 LAB — MAGNESIUM: Magnesium: 1.5 mg/dL (ref 1.5–2.5)

## 2024-06-01 LAB — CALCIUM, IONIZED: Calcium, Ion: 5.4 mg/dL (ref 4.7–5.5)

## 2024-06-01 LAB — TSH: TSH: 1.15 u[IU]/mL (ref 0.35–5.50)

## 2024-06-01 LAB — HEMOGLOBIN A1C: Hgb A1c MFr Bld: 6.8 % — ABNORMAL HIGH (ref 4.6–6.5)

## 2024-06-01 NOTE — Progress Notes (Signed)
 Subjective:     Patient ID: Samuel Sanders, male    DOB: [birth year], 68 y.o.   MRN: [id number]  Chief Complaint  Patient presents with   Medical Management of Chronic Issues    6 month f/u, fasting    HPI  Discussed the use of AI scribe software for clinical note transcription with the patient, who gave verbal consent to proceed.  History of Present Illness Samuel Sanders is a 68 year old male who presents for follow-up of chronic health conditions.  Recent viral illness (covid-19) - Three weeks ago, developed severe sore throat, fever, and chills - Tested positive for COVID-19 on three consecutive days, then negative by the following Friday - Managed symptoms with rest, water, and juice  Chronic kidney disease and electrolyte abnormalities - Followed by nephrology for chronic kidney disease - Recent laboratory evaluation showed 'high normal' calcium  levels with normal parathyroid hormone - No use of calcium  supplements - Nephrology follow-up scheduled for January 7th  Weight loss and dietary modification - Lost eleven pounds since April - Achieved weight loss by eating a larger meal at lunch and less at night - Actively managing diet to improve health  Dyslipidemia - Takes fish oil and a statin for hyperlipidemia - LDL cholesterol at desirable level - HDL cholesterol remains low - Does not drink alcohol regularly - Attempting to reduce sweets in diet  History of melanoma - Melanoma removed from chest; first occurrence of melanoma - Minimal scarring after surgery - Dermatology follow-up scheduled for November 6th  Bradycardia - History of bradycardia - Normal EKG performed prior to recent surgery     Health Maintenance Due  Topic Date Due   Zoster Vaccines- Shingrix (1 of 2) Never done   Medicare Annual Wellness (AWV)  11/16/2021   HEMOGLOBIN A1C  06/01/2024    Past Medical History:  Diagnosis Date   Essential hypertension    Hx of adenomatous colonic  polyps 01/2020   Hyperlipidemia    Type 2 diabetes mellitus (HCC)     Past Surgical History:  Procedure Laterality Date   COLONOSCOPY  2009   normal   KNEE ARTHROSCOPY Left 1983   TENDON REPAIR Right 1990   wrist   TONSILLECTOMY  1958    Family History  Problem Relation Age of Onset   Cancer Mother    Healthy Father    Brain cancer Sister    Colon cancer Neg Hx    Colon polyps Neg Hx    Esophageal cancer Neg Hx    Rectal cancer Neg Hx    Stomach cancer Neg Hx     Social History   Socioeconomic History   Marital status: Married    Spouse name: Not on file   Number of children: Not on file   Years of education: Not on file   Highest education level: 12th grade  Occupational History   Not on file  Tobacco Use   Smoking status: Former    Current packs/day: 0.00    Average packs/day: 1 pack/day for 15.0 years (15.0 ttl pk-yrs)    Types: Cigarettes    Quit date: 06/28/2004    Years since quitting: 19.9   Smokeless tobacco: Never  Vaping Use   Vaping status: Never Used  Substance and Sexual Activity   Alcohol use: Not Currently    Comment: rare beer monthly or so   Drug use: Never   Sexual activity: Yes    Birth control/protection: None  Other Topics  Concern   Not on file  Social History Narrative   Not on file   Social Drivers of Health   Financial Resource Strain: Low Risk  (04/25/2024)   Overall Financial Resource Strain (CARDIA)    Difficulty of Paying Living Expenses: Not hard at all  Food Insecurity: No Food Insecurity (04/25/2024)   Hunger Vital Sign    Worried About Running Out of Food in the Last Year: Never true    Ran Out of Food in the Last Year: Never true  Transportation Needs: No Transportation Needs (04/25/2024)   PRAPARE - Administrator, Civil Service (Medical): No    Lack of Transportation (Non-Medical): No  Physical Activity: Insufficiently Active (04/25/2024)   Exercise Vital Sign    Days of Exercise per Week: 2 days     Minutes of Exercise per Session: 40 min  Stress: No Stress Concern Present (04/25/2024)   [employer] of Occupational Health - Occupational Stress Questionnaire    Feeling of Stress: Not at all  Social Connections: Socially Integrated (04/25/2024)   Social Connection and Isolation Panel    Frequency of Communication with Friends and Family: Once a week    Frequency of Social Gatherings with Friends and Family: More than three times a week    Attends Religious Services: 1 to 4 times per year    Active Member of [employer] or Organizations: Yes    Attends Banker Meetings: 1 to 4 times per year    Marital Status: Married  Catering manager Violence: Not on file    Outpatient Medications Prior to Visit  Medication Sig Dispense Refill   allopurinol  (ZYLOPRIM ) 100 MG tablet TAKE ONE TABLET BY MOUTH ONCE A DAY 90 tablet 0   amLODipine -benazepril  (LOTREL) 5-20 MG capsule TAKE ONE CAPSULE BY MOUTH ONCE A DAY 90 capsule 0   anastrozole (ARIMIDEX) 1 MG tablet Take 1 mg by mouth daily.     aspirin EC 81 MG tablet Take 81 mg by mouth daily.     atorvastatin  (LIPITOR) 20 MG tablet TAKE ONE TABLET BY MOUTH ONCE A DAY 90 tablet 0   fenofibrate  (TRICOR ) 145 MG tablet TAKE ONE TABLET BY MOUTH ONCE A DAY 90 tablet 0   glimepiride  (AMARYL ) 2 MG tablet TAKE ONE TABLET BY MOUTH TWICE DAILY BEFORE A MEAL 180 tablet 0   metFORMIN  (GLUCOPHAGE ) 1000 MG tablet TAKE HALF TABLET BY MOUTH IN THE MORNING AND TAKE ONE AND ONE HALF TABLET IN THE EVENING 180 tablet 0   Omega-3 1000 MG CAPS Take by mouth.     Semaglutide , 2 MG/DOSE, (OZEMPIC , 2 MG/DOSE,) 8 MG/3ML SOPN INJECT 2 MG INTO THE SKIN ONCE A WEEK. 6 mL 1   testosterone  cypionate (DEPOTESTOSTERONE CYPIONATE) 200 MG/ML injection Inject 200 mg into the muscle once a week.     Continuous Glucose Sensor (DEXCOM G7 SENSOR) MISC Use to check blood sugars continuously. 2 each 5   oxyCODONE  (OXY IR/ROXICODONE ) 5 MG immediate release tablet Take 1 tablet (5 mg  total) by mouth every 6 (six) hours as needed for up to 8 doses. 8 tablet 0   No facility-administered medications prior to visit.    No Known Allergies  Review of Systems  Constitutional:  Positive for weight loss. Negative for chills, fever and malaise/fatigue.       Intentional   Eyes:  Negative for blurred vision and double vision.  Respiratory:  Negative for shortness of breath.   Cardiovascular:  Negative for chest  pain, palpitations and leg swelling.  Gastrointestinal:  Negative for abdominal pain, constipation, diarrhea, nausea and vomiting.  Genitourinary:  Negative for dysuria, frequency and urgency.  Neurological:  Negative for dizziness, focal weakness and headaches.  Psychiatric/Behavioral:  Negative for depression. The patient is not nervous/anxious.        Objective:    Physical Exam Constitutional:      General: He is not in acute distress.    Appearance: He is not ill-appearing.  Eyes:     Extraocular Movements: Extraocular movements intact.     Conjunctiva/sclera: Conjunctivae normal.     Pupils: Pupils are equal, round, and reactive to light.  Neck:     Thyroid : No thyroid  mass, thyromegaly or thyroid  tenderness.  Cardiovascular:     Rate and Rhythm: Regular rhythm. Bradycardia present.  Pulmonary:     Effort: Pulmonary effort is normal.     Breath sounds: Normal breath sounds.  Musculoskeletal:        General: Normal range of motion.     Cervical back: Normal range of motion and neck supple.     Right lower leg: No edema.     Left lower leg: No edema.  Skin:    General: Skin is warm and dry.  Neurological:     General: No focal deficit present.     Mental Status: He is alert and oriented to person, place, and time.     Cranial Nerves: No cranial nerve deficit.     Sensory: No sensory deficit.     Motor: No weakness.  Psychiatric:        Mood and Affect: Mood normal.        Behavior: Behavior normal.        Thought Content: Thought content  normal.      BP 106/64   Pulse (!) 50   Temp 97.9 F (36.6 C) (Temporal)   Ht 5' 10 (1.778 m)   Wt 206 lb (93.4 kg)   SpO2 97%   BMI 29.56 kg/m  Wt Readings from Last 3 Encounters:  06/01/24 206 lb (93.4 kg)  12/01/23 217 lb (98.4 kg)  09/01/23 219 lb (99.3 kg)       Assessment & Plan:   Problem List Items Addressed This Visit     Benign hypertension with CKD (chronic kidney disease) stage III (HCC)   Gout   Relevant Orders   Uric acid   Hypercalcemia   Relevant Orders   Comprehensive metabolic panel with GFR   Calcium , ionized (Completed)   VITAMIN D  25 Hydroxy (Vit-D Deficiency, Fractures)   Mixed hyperlipidemia   Obesity (BMI 30-39.9)   Type 2 diabetes mellitus with stage 3a chronic kidney disease, without long-term current use of insulin (HCC) - Primary   Relevant Orders   CBC with Differential/Platelet   Comprehensive metabolic panel with GFR   Hemoglobin A1c   TSH   T4, free   Microalbumin / creatinine urine ratio   Other Visit Diagnoses       Need for influenza vaccination       Relevant Orders   Flu vaccine HIGH DOSE PF(Fluzone Trivalent) (Completed)     Bradycardia       Relevant Orders   CBC with Differential/Platelet   Comprehensive metabolic panel with GFR   TSH   T4, free   Magnesium   EKG 12-Lead (Completed)       Assessment and Plan Assessment & Plan Type 2 diabetes mellitus Type 2 diabetes mellitus with recent  weight loss of 11 pounds since April. Awaiting current A1c levels to assess glycemic control. - Order A1c test to assess glycemic control  Chronic kidney disease Chronic kidney disease managed by nephrology. Recent calcium  levels were high normal, but parathyroid hormone levels were normal. Next nephrology appointment is in January. - Order urine microalbumin test to monitor kidney function - Coordinate with nephrology for ongoing management  Hyperlipidemia Hyperlipidemia with mildly elevated triglycerides. LDL levels  are at target, but HDL remains low. He is on fish oil and a statin. Encouraged dietary changes and exercise to improve HDL levels. - Order lipid panel to monitor cholesterol levels - Encourage consumption of healthy fats and regular exercise to improve HDL levels  Bradycardia Bradycardia with a pulse of 50 bpm. No previous EKG on record. He reports no issues with medications. EKG to be performed to assess heart rhythm. - Perform EKG to assess heart rhythm - EKG shows sinus bradycardia, rate 50, nonspecific T wave abnormality. No old ones  History of melanoma of skin Melanoma with recent Mohs surgery. Healing well with minimal scarring. Dermatology follow-up scheduled for November 6th.  Gout No gout flare up in years.  -Continue allopurinol  and check uric acid level   Hypertension No discussion of current hypertension management in this encounter.  Hypercalcemia Previous high normal calcium  levels with normal parathyroid hormone levels. Managed by nephrology. - Monitor calcium  levels in coordination with nephrology  Urine microalbuminuria Urine microalbuminuria monitoring as part of chronic kidney disease management. - Order urine microalbumin test to monitor kidney function  General Health Maintenance He has received flu and COVID vaccinations. Discussed shingles vaccination and its benefits. He is hesitant about shingles vaccine due to personal beliefs. - Recommend shingles vaccine and advise him to check with pharmacy and insurance for cost Preventive health care reviewed.  Counseling on healthy lifestyle including diet and exercise.  Recommend regular dental and eye exams.      Follow-Up Follow-up plans discussed for nephrology and dermatology appointments. - Follow up with nephrology in January - Follow up with dermatology on November 6th     I have discontinued Hiroyuki Ozanich. Gergen's Dexcom G7 Sensor and oxyCODONE . I am also having him maintain his testosterone  cypionate,  Omega-3, aspirin EC, anastrozole, amLODipine -benazepril , Ozempic  (2 MG/DOSE), atorvastatin , glimepiride , fenofibrate , allopurinol , and metFORMIN .  No orders of the defined types were placed in this encounter.

## 2024-06-01 NOTE — Patient Instructions (Addendum)
 Please go downstairs for your labs and a urine test.   Monitor your pulse occasionally and let me know if you see any 40s.   If you develop any dizziness, lightheadedness or fatigue let me know.

## 2024-06-02 ENCOUNTER — Ambulatory Visit: Payer: Self-pay | Admitting: Family Medicine

## 2024-06-02 ENCOUNTER — Telehealth: Payer: Self-pay

## 2024-06-02 LAB — VITAMIN D 25 HYDROXY (VIT D DEFICIENCY, FRACTURES): VITD: 112.44 ng/mL (ref 30.00–100.00)

## 2024-06-02 NOTE — Telephone Encounter (Signed)
 LM for pt to return my call

## 2024-06-02 NOTE — Telephone Encounter (Signed)
 Patient retuned Hannah's phone call. Read

## 2024-06-02 NOTE — Telephone Encounter (Signed)
 CRITICAL VALUE STICKER  CRITICAL VALUE: vitamin D  112.44  RECEIVER (on-site recipient of call): Chiquita  DATE & TIME NOTIFIED: 06/02/24 at 10:10 am  MESSENGER (representative from lab): Effie

## 2024-06-15 ENCOUNTER — Other Ambulatory Visit: Payer: Self-pay | Admitting: Family Medicine

## 2024-06-15 DIAGNOSIS — N1831 Chronic kidney disease, stage 3a: Secondary | ICD-10-CM

## 2024-06-15 DIAGNOSIS — I129 Hypertensive chronic kidney disease with stage 1 through stage 4 chronic kidney disease, or unspecified chronic kidney disease: Secondary | ICD-10-CM

## 2024-06-23 ENCOUNTER — Encounter: Payer: Self-pay | Admitting: Pharmacist

## 2024-06-23 NOTE — Progress Notes (Signed)
 Pharmacy Quality Measure Review  This patient is appearing on a report for being at risk of failing the adherence measure for hypertension (ACEi/ARB) medications this calendar year.   Medication: Amlodipine /benazepril  Last fill date: 06/15/24 for 90 day supply  Insurance report was not up to date. No action needed at this time.   Darrelyn Drum, PharmD, BCPS, CPP Clinical Pharmacist Practitioner [facility] [phone]

## 2024-07-09 ENCOUNTER — Encounter (HOSPITAL_COMMUNITY): Payer: Self-pay | Admitting: *Deleted

## 2024-07-09 ENCOUNTER — Emergency Department (HOSPITAL_COMMUNITY)
Admission: EM | Admit: 2024-07-09 | Discharge: 2024-07-09 | Disposition: A | Discharge status: Home / Self Care | Attending: Emergency Medicine | Admitting: Emergency Medicine

## 2024-07-09 ENCOUNTER — Other Ambulatory Visit: Payer: Self-pay

## 2024-07-09 DIAGNOSIS — Y9353 Activity, golf: Secondary | ICD-10-CM | POA: Diagnosis not present

## 2024-07-09 DIAGNOSIS — S39012A Strain of muscle, fascia and tendon of lower back, initial encounter: Secondary | ICD-10-CM | POA: Insufficient documentation

## 2024-07-09 DIAGNOSIS — Z7982 Long term (current) use of aspirin: Secondary | ICD-10-CM | POA: Insufficient documentation

## 2024-07-09 DIAGNOSIS — X509XXA Other and unspecified overexertion or strenuous movements or postures, initial encounter: Secondary | ICD-10-CM | POA: Diagnosis not present

## 2024-07-09 DIAGNOSIS — S3992XA Unspecified injury of lower back, initial encounter: Secondary | ICD-10-CM | POA: Diagnosis present

## 2024-07-09 MED ORDER — PREDNISONE 10 MG (21) PO TBPK: ORAL_TABLET | Freq: Every day | ORAL | 0 refills | Status: AC

## 2024-07-09 MED ORDER — PREDNISONE 20 MG PO TABS
60.0000 mg | ORAL_TABLET | Freq: Once | ORAL | Status: AC
  Administered 2024-07-09: 60 mg via ORAL
  Filled 2024-07-09: qty 3

## 2024-07-09 MED ORDER — LIDOCAINE 5 % EX PTCH: 1.0000 | MEDICATED_PATCH | CUTANEOUS | 0 refills | Status: AC

## 2024-07-09 MED ORDER — LIDOCAINE 5 % EX PTCH
1.0000 | MEDICATED_PATCH | CUTANEOUS | Status: DC
  Administered 2024-07-09: 1 via TRANSDERMAL
  Filled 2024-07-09: qty 1

## 2024-07-09 NOTE — ED Triage Notes (Signed)
 Here by POV from home for R lumbar back pain. Onset 1-2 weeks ago. Triggered by golf. Feels like MSK pain. Describes as dull, ache, twinge with movement. No relief with tylenol . Worse with movement. Denies urinary or respiratory sx. No red flag or neurologic sx. Denies sob or cough. Alert, NAD, calm, interactive, steady gait.

## 2024-07-09 NOTE — ED Provider Notes (Signed)
[place] EMERGENCY DEPARTMENT AT [facility] Provider Note   CSN: [id number] Arrival date & time: 07/09/24  1524     Patient presents with: Back Pain   Samuel Sanders is a 68 y.o. male who presents to the ED out of concern for right sided low back pain.  Was playing golf today and stated that it he noticed after playing 6 holes that he started having pain with his backswing that got progressively worse.  Had to terminate his round early secondary to pain with rotation of the torso.  No falls or injuries, pain is only with rotation of the torso, not exacerbated with general ambulation.  Is able to flex and extend the hip without pain.    Back Pain      Prior to Admission medications   Medication Sig Start Date End Date Taking? Authorizing Provider  lidocaine (LIDODERM) 5 % Place 1 patch onto the skin daily. Remove & Discard patch within 12 hours or as directed by MD 07/09/24  Yes Myriam Dorn BROCKS, PA  predniSONE (STERAPRED UNI-PAK 21 TAB) 10 MG (21) TBPK tablet Take by mouth daily. Take 6 tabs by mouth daily  for 2 days, then 5 tabs for 2 days, then 4 tabs for 2 days, then 3 tabs for 2 days, 2 tabs for 2 days, then 1 tab by mouth daily for 2 days 07/09/24  Yes Myriam Dorn C, PA  allopurinol  (ZYLOPRIM ) 100 MG tablet TAKE ONE TABLET BY MOUTH ONCE A DAY 05/12/24   Henson, Vickie L, NP-C  amLODipine -benazepril  (LOTREL) 5-20 MG capsule TAKE ONE CAPSULE BY MOUTH ONCE A DAY 06/15/24   Henson, Vickie L, NP-C  anastrozole (ARIMIDEX) 1 MG tablet Take 1 mg by mouth daily. 08/14/22   [provider]  aspirin EC 81 MG tablet Take 81 mg by mouth daily.    [provider]  atorvastatin  (LIPITOR) 20 MG tablet TAKE ONE TABLET BY MOUTH ONCE A DAY 04/26/24   Henson, Vickie L, NP-C  fenofibrate  (TRICOR ) 145 MG tablet TAKE ONE TABLET BY MOUTH ONCE A DAY 05/12/24   Henson, Vickie L, NP-C  glimepiride  (AMARYL ) 2 MG tablet TAKE ONE TABLET BY MOUTH TWICE DAILY BEFORE A MEAL  05/03/24   Henson, Vickie L, NP-C  metFORMIN  (GLUCOPHAGE ) 1000 MG tablet TAKE HALF TABLET BY MOUTH IN THE MORNING AND TAKE ONE AND ONE HALF TABLET IN THE EVENING 05/19/24   Henson, Vickie L, NP-C  Omega-3 1000 MG CAPS Take by mouth.    [provider]  Semaglutide , 2 MG/DOSE, (OZEMPIC , 2 MG/DOSE,) 8 MG/3ML SOPN INJECT 2 MG INTO THE SKIN ONCE A WEEK. 04/18/24   Henson, Vickie L, NP-C  testosterone  cypionate (DEPOTESTOSTERONE CYPIONATE) 200 MG/ML injection Inject 200 mg into the muscle once a week. 03/11/19   [provider]    Allergies: Patient has no known allergies.    Review of Systems  Musculoskeletal:  Positive for back pain.  All other systems reviewed and are negative.   Updated Vital Signs BP (!) 148/76 (BP Location: Left Arm)   Pulse 69   Temp 98.8 F (37.1 C) (Oral)   Resp 20   Wt 93.4 kg   SpO2 100%   BMI 29.56 kg/m   Physical Exam Vitals and nursing note reviewed.  Constitutional:      General: He is not in acute distress.    Appearance: Normal appearance.  HENT:     Head: Normocephalic and atraumatic.     Mouth/Throat:  Mouth: Mucous membranes are moist.     Pharynx: Oropharynx is clear.  Eyes:     Extraocular Movements: Extraocular movements intact.     Conjunctiva/sclera: Conjunctivae normal.     Pupils: Pupils are equal, round, and reactive to light.  Cardiovascular:     Rate and Rhythm: Normal rate and regular rhythm.     Pulses: Normal pulses.     Heart sounds: Normal heart sounds. No murmur heard.    No friction rub. No gallop.  Pulmonary:     Effort: Pulmonary effort is normal.     Breath sounds: Normal breath sounds.  Abdominal:     General: Abdomen is flat. Bowel sounds are normal.     Palpations: Abdomen is soft.  Musculoskeletal:        General: Normal range of motion.     Cervical back: Normal, normal range of motion and neck supple.     Thoracic back: Normal.     Lumbar back: Normal.     Right lower leg: No edema.      Left lower leg: No edema.  Skin:    General: Skin is warm and dry.     Capillary Refill: Capillary refill takes less than 2 seconds.  Neurological:     General: No focal deficit present.     Mental Status: He is alert. Mental status is at baseline.  Psychiatric:        Mood and Affect: Mood normal.     (all labs ordered are listed, but only abnormal results are displayed) Labs Reviewed - No data to display  EKG: None  Radiology: No results found.   Procedures   Medications Ordered in the ED  lidocaine (LIDODERM) 5 % 1 patch (1 patch Transdermal Patch Applied 07/09/24 1629)  predniSONE (DELTASONE) tablet 60 mg (60 mg Oral Given 07/09/24 1629)                                    Medical Decision Making Risk Prescription drug management.   Patient is able to flex and extend the right hip without pain, but pain is exacerbated with rotation of the torso.  There is no midline spinal tenderness.  There is no tenderness to palpation at the ASIS.  No trochanteric pain or tenderness.  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  Also will apply Lidoderm to the same.  Follow-up with primary care as needed.  Patient understands agrees has no further concerns at this time.  Given there is no concerning evaluation findings, no reports of saddle anesthesia, no reports of urinary or bowel incontinence, will defer imaging at this time.     Final diagnoses:  Strain of lumbar region, initial encounter    ED Discharge Orders          Ordered    lidocaine (LIDODERM) 5 %  Every 24 hours        07/09/24 1620    predniSONE (STERAPRED UNI-PAK 21 TAB) 10 MG (21) TBPK tablet  Daily        07/09/24 1620               Myriam Dorn BROCKS, PA 07/09/24 1740    Mannie Pac T,  DO 07/09/24  1952  

## 2024-07-21 ENCOUNTER — Other Ambulatory Visit: Payer: Self-pay | Admitting: Family Medicine

## 2024-07-21 DIAGNOSIS — E1122 Type 2 diabetes mellitus with diabetic chronic kidney disease: Secondary | ICD-10-CM

## 2024-07-28 ENCOUNTER — Other Ambulatory Visit: Payer: Self-pay | Admitting: Family Medicine

## 2024-07-28 DIAGNOSIS — N1831 Chronic kidney disease, stage 3a: Secondary | ICD-10-CM

## 2024-08-03 ENCOUNTER — Other Ambulatory Visit: Payer: Self-pay

## 2024-08-03 NOTE — ED Triage Notes (Signed)
 Pt states the foreskin on his penis is swollen on one sidex4d.

## 2024-08-04 ENCOUNTER — Ambulatory Visit
Admission: EM | Admit: 2024-08-04 | Discharge: 2024-08-04 | Disposition: A | Discharge status: Home / Self Care | Attending: Family Medicine | Admitting: Family Medicine

## 2024-08-04 ENCOUNTER — Other Ambulatory Visit: Payer: Self-pay

## 2024-08-04 VITALS — BP 144/76 | HR 73 | Temp 98.5°F | Resp 17

## 2024-08-04 DIAGNOSIS — N481 Balanitis: Secondary | ICD-10-CM

## 2024-08-04 MED ORDER — CLOTRIMAZOLE 1 % EX CREA: 1.0000 | TOPICAL_CREAM | Freq: Two times a day (BID) | CUTANEOUS | 0 refills | Status: AC

## 2024-08-04 NOTE — ED Triage Notes (Signed)
 Pt states the foreskin on his penis is swollen on one sidex5d

## 2024-08-04 NOTE — ED Provider Notes (Signed)
 [facility]    CSN: [id number] Arrival date & time: 08/04/24  1235      History   Chief Complaint No chief complaint on file.   HPI Samuel Sanders is a 68 y.o. male presents for penile concern.  Patient reports 5 days of swelling and redness to the left aspect of the glans.  Denies any pain injury dysuria.  No penile discharge.  No fevers or chills.  No testicular pain or swelling.  He states its improved slightly with Neosporin.  No other concerns at this time.  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 Authorizing Provider  clotrimazole  (CLOTRIMAZOLE  ANTI-FUNGAL) 1 % cream Apply 1 Application topically 2 (two) times daily for 7 days. 08/04/24 08/11/24 Yes Jaymar Loeber, Jodi R, NP  allopurinol  (ZYLOPRIM ) 100 MG tablet TAKE ONE TABLET BY MOUTH ONCE A DAY 05/12/24   Henson, Vickie  L, NP-C  amLODipine -benazepril  (LOTREL) 5-20 MG capsule TAKE ONE CAPSULE BY MOUTH ONCE A DAY 06/15/24   Henson, Vickie L, NP-C  anastrozole (ARIMIDEX) 1 MG tablet Take 1 mg by mouth daily. 08/14/22   [provider]  aspirin EC 81 MG tablet Take 81 mg by mouth daily.    [provider]  atorvastatin  (LIPITOR) 20 MG tablet TAKE ONE TABLET BY MOUTH ONCE A DAY 07/21/24   Henson, Vickie L, NP-C  fenofibrate  (TRICOR ) 145 MG tablet TAKE ONE TABLET BY MOUTH ONCE A DAY 05/12/24   Henson, Vickie L, NP-C  glimepiride  (AMARYL ) 2 MG tablet TAKE ONE TABLET BY MOUTH TWICE DAILY BEFORE A MEAL 08/01/24   Henson, Vickie L, NP-C  lidocaine  (LIDODERM ) 5 % Place 1 patch onto the skin daily. Remove & Discard patch within 12 hours or as directed by MD 07/09/24   Myriam Dorn BROCKS, PA  metFORMIN  (GLUCOPHAGE ) 1000 MG tablet TAKE HALF TABLET BY MOUTH IN THE MORNING AND TAKE ONE AND ONE HALF TABLET IN THE EVENING 05/19/24   Henson, Vickie L, NP-C  Omega-3 1000 MG CAPS Take by mouth.    [provider]  predniSONE  (STERAPRED UNI-PAK 21 TAB) 10 MG (21) TBPK tablet Take by mouth daily. Take 6 tabs by mouth daily  for 2 days, then 5 tabs for 2 days, then 4 tabs for 2 days, then 3 tabs  for 2 days, 2 tabs for 2 days, then 1 tab by mouth daily for 2 days 07/09/24   Myriam Carrier C, PA  Semaglutide , 2 MG/DOSE, (OZEMPIC , 2 MG/DOSE,) 8 MG/3ML SOPN INJECT 2 MG INTO THE SKIN ONCE A WEEK. 04/18/24   Henson, Vickie L, NP-C  testosterone  cypionate (DEPOTESTOSTERONE CYPIONATE) 200 MG/ML injection Inject 200 mg into the muscle once a week. 03/11/19   [provider]    Family History Family History  Problem Relation Age of Onset   Cancer Mother    Healthy Father    Brain cancer Sister    Colon cancer Neg Hx    Colon polyps Neg Hx    Esophageal cancer Neg Hx    Rectal cancer Neg Hx    Stomach cancer Neg Hx     Social History Social History   Tobacco Use   Smoking status: Former    Current  packs/day: 0.00    Average packs/day: 1 pack/day for 15.0 years (15.0 ttl pk-yrs)    Types: Cigarettes    Quit date: 06/28/2004    Years since quitting: 20.1   Smokeless tobacco: Never  Vaping Use   Vaping status: Never Used  Substance Use Topics   Alcohol use: Not Currently    Comment: rare beer monthly or so   Drug use: Never     Allergies   Patient has no known allergies.   Review of Systems Review of Systems  Genitourinary:  Positive for penile swelling.     Physical Exam Triage Vital Signs ED Triage Vitals  Encounter Vitals Group     BP 08/04/24 1303 (!) 144/76     Girls Systolic BP Percentile --      Girls Diastolic BP Percentile --      Boys Systolic BP Percentile --      Boys Diastolic BP Percentile --      Pulse Rate 08/04/24 1303 73     Resp 08/04/24 1303 17     Temp 08/04/24 1303 98.5 F (36.9 C)     Temp Source 08/04/24 1303 Oral     SpO2 08/04/24 1303 97 %     Weight --      Height --      Head Circumference --      Peak Flow --      Pain Score 08/04/24 1302 0     Pain Loc --      Pain Education --      Exclude from Growth Chart --    No data found.  Updated Vital Signs BP (!) 144/76   Pulse 73   Temp 98.5 F (36.9 C) (Oral)   Resp 17   SpO2 97%   Visual Acuity Right Eye Distance:   Left Eye Distance:   Bilateral Distance:    Right Eye Near:   Left Eye Near:    Bilateral Near:     Physical Exam Vitals and nursing note reviewed. Exam conducted with a chaperone present (Adrianna CMA).  Constitutional:      General: He is not in acute distress.    Appearance: Normal appearance. He is not ill-appearing.  HENT:     Head: Normocephalic and atraumatic.  Eyes:     Pupils: Pupils are equal, round, and reactive to light.  Cardiovascular:     Rate and Rhythm: Normal rate.  Pulmonary:     Effort: Pulmonary effort is normal.  Genitourinary:    Penis: Circumcised. Swelling present.  Comments: Mild erythema and swelling to the  left aspect of the glans.  No drainage, rashes, lesions or vesicles.  No penile discharge. Skin:    General: Skin is warm and dry.  Neurological:     General: No focal deficit present.     Mental Status: He is alert and oriented to person, place, and time.  Psychiatric:        Mood and Affect: Mood normal.        Behavior: Behavior normal.      UC Treatments / Results  Labs (all labs ordered are listed, but only abnormal results are displayed) Labs Reviewed - No data to display  EKG   Radiology No results found.  Procedures Procedures (including critical care time)  Medications Ordered in UC Medications - No data to display  Initial Impression / Assessment and Plan / UC Course  I have reviewed the triage vital signs and the nursing notes.  Pertinent labs & imaging results that were available during my care of the patient were reviewed by me and considered in my medical decision making (see chart for details).     Reviewed exam and symptoms with patient.  Will do trial of clotrimazole twice daily.  May continue Neosporin as well.  PCP follow-up if symptoms do not improve.  ER precautions reviewed. Final Clinical Impressions(s) / UC Diagnoses   Final diagnoses:  Balanitis     Discharge Instructions      Start clotrimazole topically twice daily for a week.  He may continue Neosporin as well.  Follow-up with your PCP if your symptoms do not improve.  Please go to the ER for any worsening symptoms.  Hope you feel better soon!    ED Prescriptions     Medication Sig Dispense Auth. Provider   clotrimazole (CLOTRIMAZOLE ANTI-FUNGAL) 1 % cream Apply 1 Application topically 2 (two) times daily for 7 days. 30 g Arthella Headings, Jodi R, NP      PDMP not reviewed this encounter.   Loreda Myla SAUNDERS, NP 08/04/24 [phone]

## 2024-08-04 NOTE — Discharge Instructions (Signed)
 Start clotrimazole topically twice daily for a week.  He may continue Neosporin as well.  Follow-up with your PCP if your symptoms do not improve.  Please go to the ER for any worsening symptoms.  Hope you feel better soon!

## 2024-08-11 ENCOUNTER — Other Ambulatory Visit: Payer: Self-pay | Admitting: Family Medicine

## 2024-08-11 DIAGNOSIS — N1831 Chronic kidney disease, stage 3a: Secondary | ICD-10-CM

## 2024-08-11 DIAGNOSIS — M109 Gout, unspecified: Secondary | ICD-10-CM

## 2024-08-16 ENCOUNTER — Other Ambulatory Visit: Payer: Self-pay | Admitting: Family Medicine

## 2024-08-16 DIAGNOSIS — N1831 Chronic kidney disease, stage 3a: Secondary | ICD-10-CM

## 2024-09-06 ENCOUNTER — Encounter: Payer: Self-pay | Admitting: Dermatology

## 2024-09-06 ENCOUNTER — Ambulatory Visit: Admitting: Dermatology

## 2024-09-06 VITALS — BP 158/79 | HR 59

## 2024-09-06 DIAGNOSIS — D1801 Hemangioma of skin and subcutaneous tissue: Secondary | ICD-10-CM

## 2024-09-06 DIAGNOSIS — D229 Melanocytic nevi, unspecified: Secondary | ICD-10-CM

## 2024-09-06 DIAGNOSIS — L905 Scar conditions and fibrosis of skin: Secondary | ICD-10-CM | POA: Diagnosis not present

## 2024-09-06 DIAGNOSIS — L578 Other skin changes due to chronic exposure to nonionizing radiation: Secondary | ICD-10-CM

## 2024-09-06 DIAGNOSIS — W908XXA Exposure to other nonionizing radiation, initial encounter: Secondary | ICD-10-CM

## 2024-09-06 DIAGNOSIS — Z86018 Personal history of other benign neoplasm: Secondary | ICD-10-CM

## 2024-09-06 DIAGNOSIS — Z86006 Personal history of melanoma in-situ: Secondary | ICD-10-CM

## 2024-09-06 DIAGNOSIS — L814 Other melanin hyperpigmentation: Secondary | ICD-10-CM

## 2024-09-06 DIAGNOSIS — C439 Malignant melanoma of skin, unspecified: Secondary | ICD-10-CM

## 2024-09-06 DIAGNOSIS — L821 Other seborrheic keratosis: Secondary | ICD-10-CM | POA: Diagnosis not present

## 2024-09-06 DIAGNOSIS — Z1283 Encounter for screening for malignant neoplasm of skin: Secondary | ICD-10-CM | POA: Diagnosis not present

## 2024-09-06 DIAGNOSIS — D239 Other benign neoplasm of skin, unspecified: Secondary | ICD-10-CM

## 2024-09-06 NOTE — Progress Notes (Signed)
 "  Total Body Skin Exam (TBSE) Visit   Subjective  Samuel Sanders is a 69 y.o. male who presents for the following: Skin Cancer Screening and Full Body Skin Exam  His sun exposure remains typical, and he uses a Eucerin product with SPF 16, which is below the recommended SPF 30.  He describes having dry, thin skin similar to his father's, which is prone to bruising. He uses Eucerin to manage dry skin, particularly during the winter months. His legs are less dry currently, and he wears long pants while golfing.  He recalls a previous procedure from July that has healed well, and he is satisfied with the outcome.  He remains active, engaging in yard work and other activities, which sometimes result in superficial scrapes and scratches, including one from his dog. He plans to retire soon, anticipating more time outdoors.  Hx of AIMP.LN- right cheek, treated in Summer 2025.  The following portions of the chart were reviewed this encounter and updated as appropriate: medications, allergies, medical history  Review of Systems:  No other skin or systemic complaints except as noted in HPI or Assessment and Plan.  Objective  Well appearing patient in no apparent distress; mood and affect are within normal limits.  A full examination was performed including scalp, head, eyes, ears, nose, lips, neck, chest, axillae, abdomen, back, buttocks, bilateral upper extremities, bilateral lower extremities, hands, feet, fingers, toes, fingernails, and toenails. All findings within normal limits unless otherwise noted below.   Relevant physical exam findings are noted in the Assessment and Plan.    Assessment & Plan   LENTIGINES, SEBORRHEIC KERATOSES, HEMANGIOMAS - Benign normal skin lesions - Benign-appearing - Call for any changes  MELANOCYTIC NEVI - Tan-brown and/or pink-flesh-colored symmetric macules and papules - Benign appearing on exam today - Observation - Call clinic for new or changing  moles - Recommend daily use of broad spectrum spf 30+ sunscreen to sun-exposed areas.   ACTINIC DAMAGE - Chronic condition, secondary to cumulative UV/sun exposure - diffuse scaly erythematous macules with underlying dyspigmentation - Recommend daily broad spectrum sunscreen SPF 30+ to sun-exposed areas, reapply every 2 hours as needed.  - Staying in the shade or wearing long sleeves, sun glasses (UVA+UVB protection) and wide brim hats (4-inch brim around the entire circumference of the hat) are also recommended for sun protection.  - Call for new or changing lesions.  SKIN CANCER SCREENING PERFORMED TODAY.  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  - Call if any new or changing lesions are noted between office visit  HISTORY OF DYSPLASTIC NEVUS No evidence of recurrence today Recommend regular full body skin exams Recommend daily broad spectrum sunscreen SPF 30+ to sun-exposed areas, reapply every 2 hours as needed.  Call if any new or changing lesions are noted between office visits  Return in about 3 months (around 12/05/2024) for  TBSE.   Documentation: I have reviewed the above documentation for accuracy and completeness, and I agree with the above.  RUFUS CHRISTELLA HOLY, MD   "

## 2024-09-13 ENCOUNTER — Other Ambulatory Visit: Payer: Self-pay | Admitting: Family Medicine

## 2024-09-13 DIAGNOSIS — I129 Hypertensive chronic kidney disease with stage 1 through stage 4 chronic kidney disease, or unspecified chronic kidney disease: Secondary | ICD-10-CM

## 2024-09-13 DIAGNOSIS — N1831 Chronic kidney disease, stage 3a: Secondary | ICD-10-CM

## 2024-09-15 ENCOUNTER — Encounter: Payer: Self-pay | Admitting: Family Medicine

## 2024-12-06 ENCOUNTER — Ambulatory Visit: Admitting: Dermatology

## 2024-12-07 ENCOUNTER — Encounter: Admitting: Family Medicine
